# Patient Record
Sex: Female | Born: 1990 | Race: White | Hispanic: No | Marital: Single | Smoking: Former smoker
Health system: Southern US, Community
[De-identification: ages and names within clinical notes are randomized; demographics above are authoritative.]

## PROBLEM LIST (undated history)

## (undated) ENCOUNTER — Inpatient Hospital Stay: Payer: Self-pay

## (undated) DIAGNOSIS — Z8659 Personal history of other mental and behavioral disorders: Secondary | ICD-10-CM

## (undated) DIAGNOSIS — F845 Asperger's syndrome: Secondary | ICD-10-CM

## (undated) DIAGNOSIS — J45909 Unspecified asthma, uncomplicated: Secondary | ICD-10-CM

## (undated) DIAGNOSIS — Z8619 Personal history of other infectious and parasitic diseases: Secondary | ICD-10-CM

## (undated) DIAGNOSIS — F429 Obsessive-compulsive disorder, unspecified: Secondary | ICD-10-CM

## (undated) DIAGNOSIS — L409 Psoriasis, unspecified: Secondary | ICD-10-CM

## (undated) HISTORY — DX: Personal history of other infectious and parasitic diseases: Z86.19

## (undated) HISTORY — DX: Personal history of other mental and behavioral disorders: Z86.59

## (undated) HISTORY — DX: Obsessive-compulsive disorder, unspecified: F42.9

## (undated) HISTORY — DX: Unspecified asthma, uncomplicated: J45.909

## (undated) HISTORY — DX: Psoriasis, unspecified: L40.9

## (undated) HISTORY — PX: NO PAST SURGERIES: SHX2092

## (undated) HISTORY — DX: Asperger's syndrome: F84.5

---
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
## 2012-11-07 ENCOUNTER — Ambulatory Visit: Payer: Self-pay | Admitting: Family Medicine

## 2012-11-12 ENCOUNTER — Encounter: Payer: Self-pay | Admitting: Family Medicine

## 2012-11-12 ENCOUNTER — Ambulatory Visit (INDEPENDENT_AMBULATORY_CARE_PROVIDER_SITE_OTHER): Payer: BC Managed Care – PPO | Admitting: Family Medicine

## 2012-11-12 VITALS — BP 98/62 | HR 77 | Temp 98.8°F | Ht 64.75 in | Wt 93.5 lb

## 2012-11-12 DIAGNOSIS — F845 Asperger's syndrome: Secondary | ICD-10-CM | POA: Insufficient documentation

## 2012-11-12 DIAGNOSIS — Z23 Encounter for immunization: Secondary | ICD-10-CM

## 2012-11-12 DIAGNOSIS — F429 Obsessive-compulsive disorder, unspecified: Secondary | ICD-10-CM

## 2012-11-12 DIAGNOSIS — F411 Generalized anxiety disorder: Secondary | ICD-10-CM | POA: Insufficient documentation

## 2012-11-12 DIAGNOSIS — F848 Other pervasive developmental disorders: Secondary | ICD-10-CM

## 2012-11-12 MED ORDER — SERTRALINE HCL 50 MG PO TABS: 50.0000 mg | ORAL_TABLET | Freq: Every day | ORAL | Status: DC

## 2012-11-12 NOTE — Progress Notes (Signed)
Subjective:    Patient ID: Gloria Morris, female    DOB: [birth year], 22 y.o.   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 , and working full time  She likes it there  She eats a fairly healthy diet - has always been very very thin -- she moves all the time , has always had a hard time gaining weight  Has a habit of rocking  She does not have an exercise routine but likes to walk   OCD- things consume her at times (mental issues), and stays organized / likes things a certain way/ occ repeats herself  Anxiety- stress over the things that consume her - like boyfriend and work Does stay tightly wound and irritable (she tends to isolate herself ), and she is easy to yell and scream   Used so smoke and she quit   Last Tdap - was 1996    A little hard time eating or sleeping after breaking up with a boyfriend  Getting over that - 2 months ago   Patient Active Problem List   Diagnosis Date Noted  . Generalized anxiety disorder 11/12/2012  . OCD (obsessive compulsive disorder) 11/12/2012  . Asperger's disorder 11/12/2012   Past Medical History  Diagnosis Date  . History of chicken pox     around age 17  . Asperger syndrome 2009  . OCD (obsessive compulsive disorder)   . History of ADHD   . History of anxiety   . Psoriasis of scalp    No past surgical history on file. History  Substance Use Topics  . Smoking status: Former Games developer  . Smokeless tobacco: Not on file  . Alcohol Use: No   Family History  Problem Relation Age of Onset  . Drug abuse Brother   . High blood pressure Maternal Grandfather    No Known Allergies No current outpatient prescriptions on file prior to visit.   No current facility-administered medications on file prior to visit.    Review of Systems    Review of  Systems  Constitutional: Negative for fever, appetite change, fatigue and unexpected weight change.  Eyes: Negative for pain and visual disturbance.  Respiratory: Negative for cough and shortness of breath.   Cardiovascular: Negative for cp or palpitations    Gastrointestinal: Negative for nausea, diarrhea and constipation.  Genitourinary: Negative for urgency and frequency.  Skin: Negative for pallor or rash   Neurological: Negative for weakness, light-headedness, numbness and headaches.  Hematological: Negative for adenopathy. Does not bruise/bleed easily.  Psychiatric/Behavioral: pos for anxiety/ OCD/ social anx and concentration issues/ neg for SI     Objective:   Physical Exam  Constitutional: She appears well-developed and well-nourished. No distress.  Slim well appearing female  HENT:  Head: Normocephalic and atraumatic.  Right Ear: External ear normal.  Left Ear: External ear normal.  Mouth/Throat: Oropharynx is clear and moist.  Scant cerumen  Eyes: Conjunctivae and EOM are normal. Pupils are equal, round, and reactive to light. Right eye exhibits no discharge. Left eye exhibits no discharge. No scleral icterus.  Neck: Normal range of motion. Neck supple. No JVD present. Carotid bruit is not present. No thyromegaly present.  Cardiovascular: Normal rate, regular rhythm, normal heart sounds and intact distal pulses.  Exam reveals  no gallop.   Pulmonary/Chest: Effort normal and breath sounds normal. No respiratory distress. She has no wheezes.  Abdominal: Soft. Bowel sounds are normal. She exhibits no distension. There is no tenderness.  Musculoskeletal: She exhibits no edema.  Lymphadenopathy:    She has no cervical adenopathy.  Neurological: She is alert. She has normal reflexes. She displays no tremor. No cranial nerve deficit. She exhibits normal muscle tone. Coordination normal.  Skin: Skin is warm and dry. No rash noted. No erythema. No pallor.  Psychiatric: Her speech is  normal and behavior is normal. Her mood appears anxious. Her affect is not blunt and not labile. Thought content is not paranoid. She does not exhibit a depressed mood. She expresses no homicidal and no suicidal ideation.          Assessment & Plan:

## 2012-11-12 NOTE — Patient Instructions (Addendum)
Start zoloft 1/2 pill once daily in the evening for 2 weeks Then - if no problems increase to 1 pill each evening  If any intolerable side effects or if you feel worse-stop the medicine and call to let me know  Continue counseling  Follow up with me in 4-6 weeks

## 2012-11-14 NOTE — Assessment & Plan Note (Signed)
See HPI Rev notes from psychology In setting of asberger's and also OCD Trial of zoloft tirtate to 50 as tol -disc poss side eff in detail  Disc stressors/coping tech and lifestyle change >25 min spent with face to face with patient, >50% counseling and/or coordinating care  F/u planned

## 2012-11-14 NOTE — Assessment & Plan Note (Signed)
Rev notes from her counselor-and this is tricky in setting of Asperger's and also anxiety  Will try zoloft-titrate to 50 mg- disc poss side eff in detail Also disc imp of activity

## 2012-11-14 NOTE — Assessment & Plan Note (Signed)
Rev notes from psychologist  Will attempt to tx her anx and OCD with zoloft and counseling

## 2012-12-17 ENCOUNTER — Ambulatory Visit: Payer: BC Managed Care – PPO | Admitting: Family Medicine

## 2012-12-24 ENCOUNTER — Ambulatory Visit (INDEPENDENT_AMBULATORY_CARE_PROVIDER_SITE_OTHER): Payer: BC Managed Care – PPO | Admitting: Family Medicine

## 2012-12-24 ENCOUNTER — Encounter: Payer: Self-pay | Admitting: Family Medicine

## 2012-12-24 VITALS — BP 98/62 | HR 95 | Temp 98.4°F | Ht 64.75 in | Wt 99.8 lb

## 2012-12-24 DIAGNOSIS — F429 Obsessive-compulsive disorder, unspecified: Secondary | ICD-10-CM

## 2012-12-24 DIAGNOSIS — F411 Generalized anxiety disorder: Secondary | ICD-10-CM

## 2012-12-24 DIAGNOSIS — L600 Ingrowing nail: Secondary | ICD-10-CM

## 2012-12-24 DIAGNOSIS — Z309 Encounter for contraceptive management, unspecified: Secondary | ICD-10-CM | POA: Insufficient documentation

## 2012-12-24 MED ORDER — MUPIROCIN 2 % EX OINT: TOPICAL_OINTMENT | Freq: Three times a day (TID) | CUTANEOUS | Status: DC

## 2012-12-24 MED ORDER — NORETHINDRONE-ETH ESTRADIOL 1-35 MG-MCG PO TABS: 1.0000 | ORAL_TABLET | Freq: Every day | ORAL | Status: DC

## 2012-12-24 NOTE — Progress Notes (Signed)
Subjective:    Patient ID: Gloria Morris, female    DOB: February 10, 1991, 22 y.o.   MRN: [id number]  HPI Here for f/u of anxiety and OCD Started the zoloft - no side eff after starting it - getting used to it  ? If any imp in OCD  Sometimes work can be stressful and she deals with it   Has not seen counselor since last visit -- has an appt upcoming   Wt is up 6 lb - appetite is good- she is very happy about that  Protein shakes and instant bkfast   Met someone new - boyfriend  Just dating  This has helped with the breakup   Wants to talk about birth control  She took OC in the past (it decreased her appetite a bit but she did ok with it) No problems remembering to take a pill every day   Not a smoker anymore   Periods are in general very light - 1 -2 days  In the past she had heavy period  Is not sexually active now   Was screened for std a year ago   Sees [facility] for gyn - summer 2013  She will return there for a pap smear    Ingrown toenail right great nail (lateral)  Used peroxide  Soaks in salt water  Getting better   Patient Active Problem List   Diagnosis Date Noted  . Ingrown right big toenail 12/24/2012  . Contraception management 12/24/2012  . Generalized anxiety disorder 11/12/2012  . OCD (obsessive compulsive disorder) 11/12/2012  . Asperger's disorder 11/12/2012   Past Medical History  Diagnosis Date  . History of chicken pox     around age 41  . Asperger syndrome 2009  . OCD (obsessive compulsive disorder)   . History of ADHD   . History of anxiety   . Psoriasis of scalp    No past surgical history on file. History  Substance Use Topics  . Smoking status: Former Games developer  . Smokeless tobacco: Not on file  . Alcohol Use: No   Family History  Problem Relation Age of Onset  . Drug abuse Brother   . High blood pressure Maternal Grandfather    No Known Allergies Current Outpatient Prescriptions on File Prior to Visit  Medication Sig  Dispense Refill  . sertraline (ZOLOFT) 50 MG tablet Take 1 tablet (50 mg total) by mouth daily.  30 tablet  3   No current facility-administered medications on file prior to visit.    Review of Systems Review of Systems  Constitutional: Negative for fever, appetite change, fatigue and unexpected weight change.  Eyes: Negative for pain and visual disturbance.  Respiratory: Negative for cough and shortness of breath.   Cardiovascular: Negative for cp or palpitations    Gastrointestinal: Negative for nausea, diarrhea and constipation.  Genitourinary: Negative for urgency and frequency.  Skin: Negative for pallor or rash  pos for redness around toenail Neurological: Negative for weakness, light-headedness, numbness and headaches.  Hematological: Negative for adenopathy. Does not bruise/bleed easily.  Psychiatric/Behavioral: Negative for dysphoric mood. The patient is anxious at times .         Objective:   Physical Exam  Constitutional: She appears well-developed and well-nourished. No distress.  Underweight and well appearing   HENT:  Head: Normocephalic and atraumatic.  Mouth/Throat: Oropharynx is clear and moist.  Eyes: Conjunctivae and EOM are normal. Pupils are equal, round, and reactive to light.  Neck: Normal range of motion.  Neck supple. No JVD present. Carotid bruit is not present. No thyromegaly present.  Cardiovascular: Normal rate, regular rhythm, normal heart sounds and intact distal pulses.  Exam reveals no gallop.   Pulmonary/Chest: Effort normal and breath sounds normal. No respiratory distress. She has no wheezes.  Abdominal: Soft. Bowel sounds are normal. She exhibits no distension, no abdominal bruit and no mass. There is no tenderness.  No suprapubic tenderness or fullness    Musculoskeletal: She exhibits no edema and no tenderness.  Lymphadenopathy:    She has no cervical adenopathy.  Neurological: She is alert. She has normal reflexes. No cranial nerve deficit.  She exhibits normal muscle tone. Coordination normal.  Skin: Skin is warm and dry. No rash noted. No erythema. No pallor.  R great toenail is laterally ingrown with erythema and slight swelling No drainage Mildly tender  Psychiatric: She has a normal mood and affect.          Assessment & Plan:

## 2012-12-24 NOTE — Assessment & Plan Note (Signed)
Will start ortho novum 1/35 Long discussion re: way to take OC properly and avoidance of smoking  Risks of blood clots outlined as well as possible side eff Pt aware that this does not prevent stds and condoms should still be used inst that it may take up to 3 months for menses to fall into rhythm or side eff to stop  Adv to call if problems or questions

## 2012-12-24 NOTE — Assessment & Plan Note (Signed)
Improved with zoloft so far No change in stressors

## 2012-12-24 NOTE — Assessment & Plan Note (Signed)
Pt seems more relaxed after start of zoloft She wishes to continue it Appetite is better and wt gain noted

## 2012-12-24 NOTE — Patient Instructions (Addendum)
Start the oral contraceptive pill the first Sunday after your period starts - that would be this Sunday Take it at the same time every day with food and don't smoke  Use condoms if sexually active  See your gyn for annual exam  Keep working on increasing calories  Keep ingrown toenail clean with antibacterial soap and water and you can soak it in epsom soap/ water , and use the bactroban ointment 2-3 times per day   If any side effects or problems please let me know   Follow up in about 6 months or sooner if needed

## 2012-12-24 NOTE — Assessment & Plan Note (Signed)
Adv to soak/ keep clean and use bactroban ointment tid Let nail grow out and then trim it straight across Update if not starting to improve in a week or if worsening  -will ref to pod for toenail removal

## 2012-12-27 ENCOUNTER — Other Ambulatory Visit: Payer: Self-pay | Admitting: *Deleted

## 2012-12-27 MED ORDER — SERTRALINE HCL 50 MG PO TABS: 50.0000 mg | ORAL_TABLET | Freq: Every day | ORAL | Status: DC

## 2012-12-27 NOTE — Telephone Encounter (Signed)
Px sent

## 2012-12-27 NOTE — Telephone Encounter (Signed)
Received fax requesting 90 day supply to mail order pharmacy, please advise

## 2013-02-17 ENCOUNTER — Ambulatory Visit (INDEPENDENT_AMBULATORY_CARE_PROVIDER_SITE_OTHER): Payer: BC Managed Care – PPO | Admitting: Family Medicine

## 2013-02-17 ENCOUNTER — Encounter: Payer: Self-pay | Admitting: Family Medicine

## 2013-02-17 VITALS — BP 104/68 | HR 86 | Temp 98.2°F | Ht 64.75 in | Wt 92.0 lb

## 2013-02-17 DIAGNOSIS — Z309 Encounter for contraceptive management, unspecified: Secondary | ICD-10-CM

## 2013-02-17 LAB — POCT URINE PREGNANCY: Preg Test, Ur: NEGATIVE

## 2013-02-17 MED ORDER — MEDROXYPROGESTERONE ACETATE 150 MG/ML IM SUSP
150.0000 mg | Freq: Once | INTRAMUSCULAR | Status: AC
  Administered 2013-02-17: 150 mg via INTRAMUSCULAR

## 2013-02-17 NOTE — Progress Notes (Signed)
Subjective:    Patient ID: Gloria Morris, female    DOB: [birth year], 22 y.o.   MRN: [id number]  HPI Here to disc contraception management - pt is on ortho novum 1/35 She has a hard time remembering to take it every day - tends to miss days Very light period monthly - and not bothersome   Interested in depo provera- she knows a bit about it  Knows that this will also help her gain weight   She has lost 7 lb with bmi of 15 - very thin   (she was surprised by this) She eats a lot of fast food- but has a physical job - always busy  She does drink a lot of coffee   Breakfast - not very hungry- often eats cereal or waffles  (her lightest meal)  Also continental bkfast at her job  Lunch - fast food - hamburger or chicken nuggets , and she used to eat a lot of fruit  Is drinking ensure - often --350 calories  Dinner - every night --meat and a vegetable   Urine preg test is neg   Patient Active Problem List   Diagnosis Date Noted  . Ingrown right big toenail 12/24/2012  . Contraception management 12/24/2012  . Generalized anxiety disorder 11/12/2012  . OCD (obsessive compulsive disorder) 11/12/2012  . Asperger's disorder 11/12/2012   Past Medical History  Diagnosis Date  . History of chicken pox     around age 34  . Asperger syndrome 2009  . OCD (obsessive compulsive disorder)   . History of ADHD   . History of anxiety   . Psoriasis of scalp    No past surgical history on file. History  Substance Use Topics  . Smoking status: Former Games developer  . Smokeless tobacco: Not on file  . Alcohol Use: No   Family History  Problem Relation Age of Onset  . Drug abuse Brother   . High blood pressure Maternal Grandfather    No Known Allergies Current Outpatient Prescriptions on File Prior to Visit  Medication Sig Dispense Refill  . mupirocin ointment (BACTROBAN) 2 % Apply topically 3 (three) times daily. To affected area on toe  22 g  0  . norethindrone-ethinyl estradiol 1/35  (ORTHO-NOVUM, NORTREL,CYCLAFEM) tablet Take 1 tablet by mouth daily.  1 Package  11  . sertraline (ZOLOFT) 50 MG tablet Take 1 tablet (50 mg total) by mouth daily.  90 tablet  3   No current facility-administered medications on file prior to visit.    Review of Systems Review of Systems  Constitutional: Negative for fever, appetite change, fatigue and unexpected weight change.  Eyes: Negative for pain and visual disturbance.  Respiratory: Negative for cough and shortness of breath.   Cardiovascular: Negative for cp or palpitations    Gastrointestinal: Negative for nausea, diarrhea and constipation.  Genitourinary: Negative for urgency and frequency.  Skin: Negative for pallor or rash   Neurological: Negative for weakness, light-headedness, numbness and headaches.  Hematological: Negative for adenopathy. Does not bruise/bleed easily.  Psychiatric/Behavioral: Negative for dysphoric mood. The patient is  nervous/anxious.  at times        Objective:   Physical Exam  Constitutional: She appears well-developed. No distress.  Underweight and well appearing   HENT:  Head: Normocephalic.  Eyes: Conjunctivae and EOM are normal. Pupils are equal, round, and reactive to light. No scleral icterus.  Neck: Normal range of motion. Neck supple. Carotid bruit is not present. No thyromegaly present.  Cardiovascular: Normal rate and regular rhythm.   Pulmonary/Chest: Effort normal and breath sounds normal.  Musculoskeletal: She exhibits no edema.  Lymphadenopathy:    She has no cervical adenopathy.  Neurological: She is alert. She has normal reflexes.  Skin: Skin is warm and dry. No pallor.  Psychiatric: She has a normal mood and affect.  Cheerful and talkative            Assessment & Plan:

## 2013-02-17 NOTE — Patient Instructions (Signed)
Depo provera injection today You will need this shot every 3 months - make your next appt on the way out If any problems or side effects let me know Continue using condoms for STD prevention

## 2013-02-17 NOTE — Assessment & Plan Note (Signed)
Will change to depo provera injection q3 mo due to difficulty remembering to take a daily pill  Disc side eff Wt gain would be welcomed  Disc imp of balanced diet with regular meals

## 2013-02-17 NOTE — Progress Notes (Signed)
Pre-visit discussion using our clinic review tool. No additional management support is needed unless otherwise documented below in the visit note.  

## 2013-05-08 ENCOUNTER — Ambulatory Visit (INDEPENDENT_AMBULATORY_CARE_PROVIDER_SITE_OTHER): Payer: BC Managed Care – PPO

## 2013-05-08 DIAGNOSIS — Z309 Encounter for contraceptive management, unspecified: Secondary | ICD-10-CM

## 2013-05-08 MED ORDER — MEDROXYPROGESTERONE ACETATE 150 MG/ML IM SUSP
150.0000 mg | Freq: Once | INTRAMUSCULAR | Status: AC
  Administered 2013-05-08: 150 mg via INTRAMUSCULAR

## 2013-06-23 ENCOUNTER — Ambulatory Visit: Payer: BC Managed Care – PPO | Admitting: Family Medicine

## 2013-07-24 ENCOUNTER — Ambulatory Visit: Payer: BC Managed Care – PPO

## 2014-02-05 ENCOUNTER — Other Ambulatory Visit: Payer: Self-pay | Admitting: Family Medicine

## 2014-02-05 NOTE — Telephone Encounter (Signed)
Please schedule a winter f/u and refill until then 

## 2014-02-05 NOTE — Telephone Encounter (Signed)
Electronic refill request, please advise  

## 2014-02-05 NOTE — Telephone Encounter (Signed)
Called pt and no answer and no voicemail set up 

## 2014-02-06 NOTE — Telephone Encounter (Signed)
Tried calling patient, VM not set up, refill denied until patient calls and set up appt.

## 2017-04-30 LAB — OB RESULTS CONSOLE ANTIBODY SCREEN: Antibody Screen: NEGATIVE

## 2017-04-30 LAB — OB RESULTS CONSOLE ABO/RH: RH Type: POSITIVE

## 2017-04-30 LAB — OB RESULTS CONSOLE HEPATITIS B SURFACE ANTIGEN: HEP B S AG: NEGATIVE

## 2017-04-30 LAB — OB RESULTS CONSOLE RPR: RPR: NONREACTIVE

## 2017-04-30 LAB — OB RESULTS CONSOLE RUBELLA ANTIBODY, IGM: RUBELLA: NON-IMMUNE/NOT IMMUNE

## 2017-04-30 LAB — OB RESULTS CONSOLE HIV ANTIBODY (ROUTINE TESTING): HIV: NONREACTIVE

## 2017-04-30 LAB — OB RESULTS CONSOLE HGB/HCT, BLOOD: Hemoglobin: 11.5

## 2017-05-21 ENCOUNTER — Encounter: Payer: Medicaid Other | Admitting: Advanced Practice Midwife

## 2017-05-23 ENCOUNTER — Observation Stay
Admission: EM | Admit: 2017-05-23 | Discharge: 2017-05-23 | Disposition: A | Discharge status: Home / Self Care | Payer: Medicaid Other | Attending: Obstetrics & Gynecology | Admitting: Obstetrics & Gynecology

## 2017-05-23 DIAGNOSIS — Z79899 Other long term (current) drug therapy: Secondary | ICD-10-CM | POA: Diagnosis not present

## 2017-05-23 DIAGNOSIS — B9689 Other specified bacterial agents as the cause of diseases classified elsewhere: Secondary | ICD-10-CM | POA: Diagnosis present

## 2017-05-23 DIAGNOSIS — N390 Urinary tract infection, site not specified: Secondary | ICD-10-CM | POA: Diagnosis present

## 2017-05-23 DIAGNOSIS — Z3A26 26 weeks gestation of pregnancy: Secondary | ICD-10-CM | POA: Diagnosis not present

## 2017-05-23 DIAGNOSIS — O2342 Unspecified infection of urinary tract in pregnancy, second trimester: Secondary | ICD-10-CM | POA: Insufficient documentation

## 2017-05-23 DIAGNOSIS — N760 Acute vaginitis: Secondary | ICD-10-CM

## 2017-05-23 DIAGNOSIS — F429 Obsessive-compulsive disorder, unspecified: Secondary | ICD-10-CM | POA: Diagnosis not present

## 2017-05-23 DIAGNOSIS — O23592 Infection of other part of genital tract in pregnancy, second trimester: Secondary | ICD-10-CM

## 2017-05-23 DIAGNOSIS — O99342 Other mental disorders complicating pregnancy, second trimester: Secondary | ICD-10-CM | POA: Diagnosis not present

## 2017-05-23 DIAGNOSIS — F845 Asperger's syndrome: Secondary | ICD-10-CM | POA: Insufficient documentation

## 2017-05-23 DIAGNOSIS — O9989 Other specified diseases and conditions complicating pregnancy, childbirth and the puerperium: Secondary | ICD-10-CM | POA: Diagnosis present

## 2017-05-23 LAB — WET PREP, GENITAL
Sperm: NONE SEEN
TRICH WET PREP: NONE SEEN
Yeast Wet Prep HPF POC: NONE SEEN

## 2017-05-23 LAB — URINALYSIS, COMPLETE (UACMP) WITH MICROSCOPIC
Bilirubin Urine: NEGATIVE
GLUCOSE, UA: NEGATIVE mg/dL
Hgb urine dipstick: NEGATIVE
Ketones, ur: NEGATIVE mg/dL
Nitrite: NEGATIVE
Protein, ur: NEGATIVE mg/dL
SPECIFIC GRAVITY, URINE: 1.013 (ref 1.005–1.030)
pH: 6 (ref 5.0–8.0)

## 2017-05-23 LAB — CHLAMYDIA/NGC RT PCR (ARMC ONLY)
CHLAMYDIA TR: NOT DETECTED
N GONORRHOEAE: NOT DETECTED

## 2017-05-23 MED ORDER — METRONIDAZOLE 0.75 % VA GEL: 1.0000 | Freq: Every day | VAGINAL | 0 refills | Status: AC

## 2017-05-23 MED ORDER — CEPHALEXIN 500 MG PO CAPS: 500.0000 mg | ORAL_CAPSULE | Freq: Two times a day (BID) | ORAL | 0 refills | Status: AC

## 2017-05-23 MED ORDER — ACETAMINOPHEN 325 MG PO TABS: 650.0000 mg | ORAL_TABLET | ORAL | Status: DC | PRN

## 2017-05-23 NOTE — Final Progress Note (Addendum)
Physician Final Progress Note  Patient ID: Gloria Morris MRN: [id number] DOB/AGE: 28/09/1990 28 y.o.  Admit date: 05/23/2017 Admitting provider: Nadara Mustardobert P Harris, MD Discharge date: 05/23/2017   Admission Diagnoses: Possible rupture of membranes  Discharge Diagnoses:  Active Problems:   Bacterial vaginosis   Urinary tract infection   History of Present Illness: The patient is a 27 y.o. female G1P0 at 726w 2d who presents for an episode today at 1100 where she felt fluid on her leg that was clear and odorless. She has not been wearing a pad and has not noticed any more leaking since then. She has a similar episode a week ago. She has noticed occasional intermittent uterine cramping daily, especially at night. Nitrazine was negative on presentation to triage. She denies vaginal bleeding and endorses good fetal movement.  10 point review of systems negative unless otherwise noted in HPI.  Past Medical History:  Diagnosis Date  . Asperger syndrome 2009  . History of ADHD   . History of anxiety   . History of chicken pox    around age 89  . OCD (obsessive compulsive disorder)   . Psoriasis of scalp     History reviewed. No pertinent surgical history.  No current facility-administered medications on file prior to encounter.    Current Outpatient Medications on File Prior to Encounter  Medication Sig Dispense Refill  . Prenatal Vit-Fe Fumarate-FA (PRENATAL MULTIVITAMIN) TABS tablet Take 1 tablet by mouth daily at 12 noon.    . mupirocin ointment (BACTROBAN) 2 % Apply topically 3 (three) times daily. To affected area on toe (Patient not taking: Reported on 05/23/2017) 22 g 0  . sertraline (ZOLOFT) 50 MG tablet Take 1 tablet (50 mg total) by mouth daily. (Patient not taking: Reported on 05/23/2017) 90 tablet 3    No Known Allergies  Social History   Socioeconomic History  . Marital status: Single    Spouse name: Not on file  . Number of children: Not on file  . Years of  education: Not on file  . Highest education level: Not on file  Social Needs  . Financial resource strain: Not on file  . Food insecurity - worry: Not on file  . Food insecurity - inability: Not on file  . Transportation needs - medical: Not on file  . Transportation needs - non-medical: Not on file  Occupational History  . Not on file  Tobacco Use  . Smoking status: Never Smoker  . Smokeless tobacco: Never Used  Substance and Sexual Activity  . Alcohol use: No  . Drug use: No  . Sexual activity: Not on file  Other Topics Concern  . Not on file  Social History Narrative  . Not on file    Physical Exam: BP (!) 111/51   Pulse 93   Temp 98.3 F (36.8 C) (Oral)   Resp 16   Gen: NAD CV: RRR Abdomen: gravid, non-tender, non-distended Pelvic: SSE: cervix visually closed, no blood in vault, no pooling, moderate amount of thin, white, adherent discharge present, no ferning on microscopic examination, nitrazine negative, pH>4.5 Ext: no signs of DVT  FWB: Category I EFM with baseline 145, moderate variability, positive accelerations, negative decelerations, toco shows occasional uterine irritability.  Consults: None  Significant Findings/ Diagnostic Studies: labs: clue cells present on wet prep, UA findings consistent with UTI. Urine culture and GC pending.  Procedures: SSE  Discharge Condition: good  Disposition: Discharge home with antimicrobal therapy  Diet: Regular diet  Discharge Activity:  Activity as tolerated  Discharge Instructions    Discharge activity:  No Restrictions   Complete by:  As directed    Discharge diet:  No restrictions   Complete by:  As directed    No sexual activity restrictions   Complete by:  As directed    Notify physician for a general feeling that "something is not right"   Complete by:  As directed    Notify physician for increase or change in vaginal discharge   Complete by:  As directed    Notify physician for intestinal cramps, with  or without diarrhea, sometimes described as "gas pain"   Complete by:  As directed    Notify physician for leaking of fluid   Complete by:  As directed    Notify physician for low, dull backache, unrelieved by heat or Tylenol   Complete by:  As directed    Notify physician for menstrual like cramps   Complete by:  As directed    Notify physician for pelvic pressure   Complete by:  As directed    Notify physician for uterine contractions.  These may be painless and feel like the uterus is tightening or the baby is  "balling up"   Complete by:  As directed    Notify physician for vaginal bleeding   Complete by:  As directed    PRETERM LABOR:  Includes any of the follwing symptoms that occur between 20 - [redacted] weeks gestation.  If these symptoms are not stopped, preterm labor can result in preterm delivery, placing your baby at risk   Complete by:  As directed      Allergies as of 05/23/2017   No Known Allergies     Medication List    STOP taking these medications   mupirocin ointment 2 % Commonly known as:  BACTROBAN   sertraline 50 MG tablet Commonly known as:  ZOLOFT     TAKE these medications   cephALEXin 500 MG capsule Commonly known as:  KEFLEX Take 1 capsule (500 mg total) by mouth 2 (two) times daily for 10 days.   metroNIDAZOLE 0.75 % vaginal gel Commonly known as:  METROGEL Place 1 Applicatorful vaginally at bedtime for 5 days.   prenatal multivitamin Tabs tablet Take 1 tablet by mouth daily at 12 noon.      Will notify patient if pending labs require change in therapy. NOB appointment at [facility] on 05/28/2017, patient states that records have been requested from previous practice in [place].  Total time spent taking care of this patient: 20 minutes  Signed: Oswaldo Conroy, CNM  05/23/2017, 4:20 PM

## 2017-05-23 NOTE — Discharge Summary (Signed)
See Final Progress Note 05/23/2017.  Marcelyn BruinsJacelyn Bowden Boody, CNM 05/23/2017  4:23 PM

## 2017-05-23 NOTE — OB Triage Note (Signed)
27 y/o G1P0 27w presents to [facility] d/t LOF. Pt says she noticed a gush of fluid one week ago and then today at 1100am she noticed fluid running down her leg that was clear and odorless. She reports positive fetal movement and denies vaginal bleeding. VSS. Monitors applied and assessing. Nitrazine negative.

## 2017-05-24 ENCOUNTER — Telehealth: Payer: Self-pay

## 2017-05-24 LAB — URINE CULTURE: Culture: NO GROWTH

## 2017-05-24 NOTE — Telephone Encounter (Signed)
Pt possibly leaking fluid. [phone]  [facility].  I didn't hear back from pt.  I called her again 2/14 9:05am Pt states she was in the ED when she received my call.  She was dx'd c UTI and BV, was given a cream and an antibx.

## 2017-05-28 ENCOUNTER — Encounter: Payer: Self-pay | Admitting: Obstetrics and Gynecology

## 2017-05-28 ENCOUNTER — Ambulatory Visit (INDEPENDENT_AMBULATORY_CARE_PROVIDER_SITE_OTHER): Payer: Medicaid Other | Admitting: Obstetrics and Gynecology

## 2017-05-28 ENCOUNTER — Other Ambulatory Visit: Payer: Self-pay | Admitting: Obstetrics and Gynecology

## 2017-05-28 VITALS — BP 102/62 | Wt 123.0 lb

## 2017-05-28 DIAGNOSIS — Z2839 Other underimmunization status: Secondary | ICD-10-CM | POA: Insufficient documentation

## 2017-05-28 DIAGNOSIS — D649 Anemia, unspecified: Secondary | ICD-10-CM

## 2017-05-28 DIAGNOSIS — Z3400 Encounter for supervision of normal first pregnancy, unspecified trimester: Secondary | ICD-10-CM

## 2017-05-28 DIAGNOSIS — Z1379 Encounter for other screening for genetic and chromosomal anomalies: Secondary | ICD-10-CM

## 2017-05-28 DIAGNOSIS — Z3A27 27 weeks gestation of pregnancy: Secondary | ICD-10-CM

## 2017-05-28 DIAGNOSIS — Z3490 Encounter for supervision of normal pregnancy, unspecified, unspecified trimester: Secondary | ICD-10-CM

## 2017-05-28 DIAGNOSIS — O09899 Supervision of other high risk pregnancies, unspecified trimester: Secondary | ICD-10-CM

## 2017-05-28 DIAGNOSIS — K5903 Drug induced constipation: Secondary | ICD-10-CM

## 2017-05-28 DIAGNOSIS — O9989 Other specified diseases and conditions complicating pregnancy, childbirth and the puerperium: Secondary | ICD-10-CM

## 2017-05-28 DIAGNOSIS — Z283 Underimmunization status: Secondary | ICD-10-CM

## 2017-05-28 MED ORDER — FERROUS SULFATE 325 (65 FE) MG PO TABS: 325.0000 mg | ORAL_TABLET | Freq: Two times a day (BID) | ORAL | 1 refills | Status: DC

## 2017-05-28 MED ORDER — DOCUSATE SODIUM 100 MG PO CAPS: 100.0000 mg | ORAL_CAPSULE | Freq: Two times a day (BID) | ORAL | 2 refills | Status: DC | PRN

## 2017-05-28 NOTE — Progress Notes (Addendum)
Pt reports no problems. NOB transfer of care from [facility]. Records received.    05/28/2017   Chief Complaint: Missed period  Transfer of Care Patient: yes  History of Present Illness: Ms. Gloria Morris is a 26 y.o. G1P0 [redacted]w[redacted]d based on No LMP recorded. Patient is pregnant. with an Estimated Date of Delivery: 08/27/17, with the above CC.   Her periods were: regular periods every 28 days She was using no method when she conceived.  She has Negative signs or symptoms of nausea/vomiting of pregnancy. She has Negative signs or symptoms of miscarriage or preterm labor She identifies Negative Zika risk factors for her and her partner On any different medications around the time she conceived/early pregnancy: No  History of varicella: Yes   ROS: A 12-point review of systems was performed and negative, except as stated in the above HPI.  OBGYN History: As per HPI. OB History  Gravida Para Term Preterm AB Living  1            SAB TAB Ectopic Multiple Live Births               # Outcome Date GA Lbr Len/2nd Weight Sex Delivery Anes PTL Lv  1 Current               Any issues with any prior pregnancies: not applicable Any prior children are healthy, doing well, without any problems or issues: not applicable History of pap smears: Yes. Last pap smear 2019. NIL History of STIs: No   Past Medical History: Past Medical History:  Diagnosis Date  . Asperger syndrome 2009  . History of ADHD   . History of anxiety   . History of chicken pox    around age 3  . OCD (obsessive compulsive disorder)   . Psoriasis of scalp     Past Surgical History: Past Surgical History:  Procedure Laterality Date  . NO PAST SURGERIES      Family History:  Family History  Problem Relation Age of Onset  . Drug abuse Brother   . High blood pressure Maternal Grandfather    She denies any female cancers, bleeding or blood clotting disorders.  She denies any history of mental retardation, birth defects or  genetic disorders in her or the FOB's history  Social History:  Social History   Socioeconomic History  . Marital status: Single    Spouse name: Not on file  . Number of children: Not on file  . Years of education: Not on file  . Highest education level: Not on file  Social Needs  . Financial resource strain: Not on file  . Food insecurity - worry: Not on file  . Food insecurity - inability: Not on file  . Transportation needs - medical: Not on file  . Transportation needs - non-medical: Not on file  Occupational History  . Not on file  Tobacco Use  . Smoking status: Never Smoker  . Smokeless tobacco: Never Used  Substance and Sexual Activity  . Alcohol use: No  . Drug use: No  . Sexual activity: Not Currently    Birth control/protection: None  Other Topics Concern  . Not on file  Social History Narrative  . Not on file   Any pets in the household: no Cautioned about cat liter  Allergy: No Known Allergies  Current Outpatient Medications:  Current Outpatient Medications:  .  cephALEXin (KEFLEX) 500 MG capsule, Take 1 capsule (500 mg total) by mouth 2 (two) times daily   for 10 days., Disp: 20 capsule, Rfl: 0 .  metroNIDAZOLE (METROGEL) 0.75 % vaginal gel, Place 1 Applicatorful vaginally at bedtime for 5 days., Disp: 50 g, Rfl: 0 .  Prenatal Vit-Fe Fumarate-FA (PRENATAL MULTIVITAMIN) TABS tablet, Take 1 tablet by mouth daily at 12 noon., Disp: , Rfl:    Physical Exam:   BP 102/62   Wt 123 lb (55.8 kg)   BMI 20.63 kg/m  Body mass index is 20.63 kg/m. Constitutional: Well nourished, well developed female in no acute distress.  Neck:  Supple, normal appearance, and no thyromegaly  Cardiovascular: S1, S2 normal, no murmur, rub or gallop, regular rate and rhythm Respiratory:  Clear to auscultation bilateral. Normal respiratory effort Abdomen: positive bowel sounds and no masses, hernias; diffusely non tender to palpation, non distended Neuro/Psych:  Normal mood and  affect.  Skin:  Warm and dry.  Lymphatic:  No inguinal lymphadenopathy.   Uterus Fundal height 26cm FHR 141 bpm  Assessment: Gloria Morris is a 26 y.o. G1P0 [redacted]w[redacted]d based on No LMP recorded. Patient is pregnant. with an Estimated Date of Delivery: 08/27/17,  for prenatal care.  Plan:  1) Avoid alcoholic beverages. 2) Patient encouraged not to smoke.  3) Discontinue the use of all non-medicinal drugs and chemicals.  4) Take prenatal vitamins daily.  5) Seatbelt use advised 6) Nutrition, food safety (fish, cheese advisories, and high nitrite foods) and exercise discussed. 7) Hospital and practice style delivering at [facility] discussed  8) Patient is asked about travel to areas at risk for the Zika virus, and counseled to avoid travel and exposure to mosquitoes or sexual partners who may have themselves been exposed to the virus. Testing is discussed, and will be ordered as appropriate.  9) Childbirth classes at [facility] advised 10) Genetic Screening, such as with 1st Trimester Screening, cell free fetal DNA, AFP testing, and Ultrasound, as well as with amniocentesis and CVS as appropriate, is discussed with patient. She plans to have genetic testing this pregnancy. Maternity 21 ordered today 11) Anemia, recommended BID ferrous sulfate and iron rich foods discussed 12) Fetal pyelectasis- infant will need US after birth  Problem list reviewed and updated.  Christanna Schuman MD [facility], [facility] 05/28/2017  2:46 PM    

## 2017-05-28 NOTE — Progress Notes (Signed)
Varicella lab ordered

## 2017-05-28 NOTE — Patient Instructions (Signed)

## 2017-06-02 LAB — MATERNIT 21 PLUS CORE, BLOOD
CHROMOSOME 18: NEGATIVE
Chromosome 13: NEGATIVE
Chromosome 21: NEGATIVE
Y Chromosome: NOT DETECTED

## 2017-06-03 NOTE — Progress Notes (Signed)
Called, no answer, please call patient with result. Normal XX fetus. Thank you. Dr. Jerene PitchSchuman.

## 2017-06-03 NOTE — Progress Notes (Signed)
Pt reports no problems. [facility] Records received.    05/28/2017   Chief Complaint: Missed period  Transfer of Care Patient: yes  History of Present Illness: Ms. Gloria Morris is a 27 y.o. G1P0 7343w0d based on No LMP recorded. Patient is pregnant. with an Estimated Date of Delivery: 08/27/17, with the above CC.   Her periods were: regular periods every 28 days She was using no method when she conceived.  She has Negative signs or symptoms of nausea/vomiting of pregnancy. She has Negative signs or symptoms of miscarriage or preterm labor She identifies Negative Zika risk factors for her and her partner On any different medications around the time she conceived/early pregnancy: No  History of varicella: Yes   ROS: A 12-point review of systems was performed and negative, except as stated in the above HPI.  OBGYN History: As per HPI. OB History  Gravida Para Term Preterm AB Living  1            SAB TAB Ectopic Multiple Live Births               # Outcome Date GA Lbr Len/2nd Weight Sex Delivery Anes PTL Lv  1 Current               Any issues with any prior pregnancies: not applicable Any prior children are healthy, doing well, without any problems or issues: not applicable History of pap smears: Yes. Last pap smear 2019. NIL History of STIs: No   Past Medical History: Past Medical History:  Diagnosis Date  . Asperger syndrome 2009  . History of ADHD   . History of anxiety   . History of chicken pox    around age 89  . OCD (obsessive compulsive disorder)   . Psoriasis of scalp     Past Surgical History: Past Surgical History:  Procedure Laterality Date  . NO PAST SURGERIES      Family History:  Family History  Problem Relation Age of Onset  . Drug abuse Brother   . High blood pressure Maternal Grandfather    She denies any female cancers, bleeding or blood clotting disorders.  She denies any history of mental retardation, birth defects or  genetic disorders in her or the FOB's history  Social History:  Social History   Socioeconomic History  . Marital status: Single    Spouse name: Not on file  . Number of children: Not on file  . Years of education: Not on file  . Highest education level: Not on file  Social Needs  . Financial resource strain: Not on file  . Food insecurity - worry: Not on file  . Food insecurity - inability: Not on file  . Transportation needs - medical: Not on file  . Transportation needs - non-medical: Not on file  Occupational History  . Not on file  Tobacco Use  . Smoking status: Never Smoker  . Smokeless tobacco: Never Used  Substance and Sexual Activity  . Alcohol use: No  . Drug use: No  . Sexual activity: Not Currently    Birth control/protection: None  Other Topics Concern  . Not on file  Social History Narrative  . Not on file   Any pets in the household: no Cautioned about cat liter  Allergy: No Known Allergies  Current Outpatient Medications:  Current Outpatient Medications:  .  cephALEXin (KEFLEX) 500 MG capsule, Take 1 capsule (500 mg total) by mouth 2 (two) times daily  for 10 days., Disp: 20 capsule, Rfl: 0 .  metroNIDAZOLE (METROGEL) 0.75 % vaginal gel, Place 1 Applicatorful vaginally at bedtime for 5 days., Disp: 50 g, Rfl: 0 .  Prenatal Vit-Fe Fumarate-FA (PRENATAL MULTIVITAMIN) TABS tablet, Take 1 tablet by mouth daily at 12 noon., Disp: , Rfl:    Physical Exam:   BP 102/62   Wt 123 lb (55.8 kg)   BMI 20.63 kg/m  Body mass index is 20.63 kg/m. Constitutional: Well nourished, well developed female in no acute distress.  Neck:  Supple, normal appearance, and no thyromegaly  Cardiovascular: S1, S2 normal, no murmur, rub or gallop, regular rate and rhythm Respiratory:  Clear to auscultation bilateral. Normal respiratory effort Abdomen: positive bowel sounds and no masses, hernias; diffusely non tender to palpation, non distended Neuro/Psych:  Normal mood and  affect.  Skin:  Warm and dry.  Lymphatic:  No inguinal lymphadenopathy.   Uterus Fundal height 26cm FHR 141 bpm  Assessment: Gloria Morris is a 27 y.o. G1P0 [redacted]w[redacted]d based on No LMP recorded. Patient is pregnant. with an Estimated Date of Delivery: 08/27/17,  for prenatal care.  Plan:  1) Avoid alcoholic beverages. 2) Patient encouraged not to smoke.  3) Discontinue the use of all non-medicinal drugs and chemicals.  4) Take prenatal vitamins daily.  5) Seatbelt use advised 6) Nutrition, food safety (fish, cheese advisories, and high nitrite foods) and exercise discussed. 7) Hospital and practice style delivering at [facility] discussed  8) Patient is asked about travel to areas at risk for the Zika virus, and counseled to avoid travel and exposure to mosquitoes or sexual partners who may have themselves been exposed to the virus. Testing is discussed, and will be ordered as appropriate.  9) Childbirth classes at [facility] advised 10) Genetic Screening, such as with 1st Trimester Screening, cell free fetal DNA, AFP testing, and Ultrasound, as well as with amniocentesis and CVS as appropriate, is discussed with patient. She plans to have genetic testing this pregnancy. Maternity 21 ordered today 11) Anemia, recommended BID ferrous sulfate and iron rich foods discussed 12) Fetal pyelectasis- infant will need [place] after birth  Problem list reviewed and updated.  Adelene Idler MD [facility], [facility] 05/28/2017  2:46 PM

## 2017-06-05 ENCOUNTER — Ambulatory Visit (INDEPENDENT_AMBULATORY_CARE_PROVIDER_SITE_OTHER): Payer: Medicaid Other | Admitting: Obstetrics & Gynecology

## 2017-06-05 ENCOUNTER — Other Ambulatory Visit: Payer: Medicaid Other

## 2017-06-05 VITALS — BP 120/80 | Wt 118.0 lb

## 2017-06-05 DIAGNOSIS — O35EXX0 Maternal care for other (suspected) fetal abnormality and damage, fetal genitourinary anomalies, not applicable or unspecified: Secondary | ICD-10-CM

## 2017-06-05 DIAGNOSIS — Z3A28 28 weeks gestation of pregnancy: Secondary | ICD-10-CM

## 2017-06-05 DIAGNOSIS — Z3400 Encounter for supervision of normal first pregnancy, unspecified trimester: Secondary | ICD-10-CM

## 2017-06-05 DIAGNOSIS — O358XX0 Maternal care for other (suspected) fetal abnormality and damage, not applicable or unspecified: Secondary | ICD-10-CM

## 2017-06-05 NOTE — Progress Notes (Signed)
  Subjective  Fetal Movement? yes Contractions? no Leaking Fluid? no Vaginal Bleeding? no  Objective  BP 120/80   Wt 118 lb (53.5 kg)   BMI 19.79 kg/m  General: NAD Pumonary: no increased work of breathing Abdomen: gravid, non-tender Extremities: no edema Psychiatric: mood appropriate, affect full  Assessment  27 y.o. G1P0 at 44w1dby  08/27/2017, by Other Basis presenting for routine prenatal visit  Plan   Problem List Items Addressed This Visit      Other   Supervision of normal first pregnancy, antepartum    Other Visit Diagnoses    [redacted] weeks gestation of pregnancy    -  Primary   Relevant Orders   [place] Follow Up   Encounter for repeat ultrasound of fetal pyelectasis in singleton pregnancy, antepartum       Relevant Orders   [place] Follow Up    Flu shot discussed, not today but will consider TDaP discussed, nv MMR pp [place] for follow up here on fetal pyelectasis; plan pediatrician f/u after delivery 28 weeks labs soon cfDNA discussed PNV, [place] and mom discussed her Aspergers Syn; possible anxiety surrounding pregnancy and labor; to be aware  PBarnett Applebaum MD, [facility], [facility] 06/05/2017  11:50 AM

## 2017-06-05 NOTE — Patient Instructions (Signed)
Third Trimester of Pregnancy The third trimester is from week 28 through week 40 (months 7 through 9). The third trimester is a time when the unborn baby (fetus) is growing rapidly. At the end of the ninth month, the fetus is about 20 inches in length and weighs 6-10 pounds. Body changes during your third trimester Your body will continue to go through many changes during pregnancy. The changes vary from woman to woman. During the third trimester:  Your weight will continue to increase. You can expect to gain 25-35 pounds (11-16 kg) by the end of the pregnancy.  You may begin to get stretch marks on your hips, abdomen, and breasts.  You may urinate more often because the fetus is moving lower into your pelvis and pressing on your bladder.  You may develop or continue to have heartburn. This is caused by increased hormones that slow down muscles in the digestive tract.  You may develop or continue to have constipation because increased hormones slow digestion and cause the muscles that push waste through your intestines to relax.  You may develop hemorrhoids. These are swollen veins (varicose veins) in the rectum that can itch or be painful.  You may develop swollen, bulging veins (varicose veins) in your legs.  You may have increased body aches in the pelvis, back, or thighs. This is due to weight gain and increased hormones that are relaxing your joints.  You may have changes in your hair. These can include thickening of your hair, rapid growth, and changes in texture. Some women also have hair loss during or after pregnancy, or hair that feels dry or thin. Your hair will most likely return to normal after your baby is born.  Your breasts will continue to grow and they will continue to become tender. A yellow fluid (colostrum) may leak from your breasts. This is the first milk you are producing for your baby.  Your belly button may stick out.  You may notice more swelling in your hands,  face, or ankles.  You may have increased tingling or numbness in your hands, arms, and legs. The skin on your belly may also feel numb.  You may feel short of breath because of your expanding uterus.  You may have more problems sleeping. This can be caused by the size of your belly, increased need to urinate, and an increase in your body's metabolism.  You may notice the fetus "dropping," or moving lower in your abdomen (lightening).  You may have increased vaginal discharge.  You may notice your joints feel loose and you may have pain around your pelvic bone.  What to expect at prenatal visits You will have prenatal exams every 2 weeks until week 36. Then you will have weekly prenatal exams. During a routine prenatal visit:  You will be weighed to make sure you and the baby are growing normally.  Your blood pressure will be taken.  Your abdomen will be measured to track your baby's growth.  The fetal heartbeat will be listened to.  Any test results from the previous visit will be discussed.  You may have a cervical check near your due date to see if your cervix has softened or thinned (effaced).  You will be tested for Group B streptococcus. This happens between 35 and 37 weeks.  Your health care provider may ask you:  What your birth plan is.  How you are feeling.  If you are feeling the baby move.  If you have had   any abnormal symptoms, such as leaking fluid, bleeding, severe headaches, or abdominal cramping.  If you are using any tobacco products, including cigarettes, chewing tobacco, and electronic cigarettes.  If you have any questions.  Other tests or screenings that may be performed during your third trimester include:  Blood tests that check for low iron levels (anemia).  Fetal testing to check the health, activity level, and growth of the fetus. Testing is done if you have certain medical conditions or if there are problems during the  pregnancy.  Nonstress test (NST). This test checks the health of your baby to make sure there are no signs of problems, such as the baby not getting enough oxygen. During this test, a belt is placed around your belly. The baby is made to move, and its heart rate is monitored during movement.  What is false labor? False labor is a condition in which you feel small, irregular tightenings of the muscles in the womb (contractions) that usually go away with rest, changing position, or drinking water. These are called Braxton Hicks contractions. Contractions may last for hours, days, or even weeks before true labor sets in. If contractions come at regular intervals, become more frequent, increase in intensity, or become painful, you should see your health care provider. What are the signs of labor?  Abdominal cramps.  Regular contractions that start at 10 minutes apart and become stronger and more frequent with time.  Contractions that start on the top of the uterus and spread down to the lower abdomen and back.  Increased pelvic pressure and dull back pain.  A watery or bloody mucus discharge that comes from the vagina.  Leaking of amniotic fluid. This is also known as your "water breaking." It could be a slow trickle or a gush. Let your health care provider know if it has a color or strange odor. If you have any of these signs, call your health care provider right away, even if it is before your due date. Follow these instructions at home: Medicines  Follow your health care provider's instructions regarding medicine use. Specific medicines may be either safe or unsafe to take during pregnancy.  Take a prenatal vitamin that contains at least 600 micrograms (mcg) of folic acid.  If you develop constipation, try taking a stool softener if your health care provider approves. Eating and drinking  Eat a balanced diet that includes fresh fruits and vegetables, whole grains, good sources of protein  such as meat, eggs, or tofu, and low-fat dairy. Your health care provider will help you determine the amount of weight gain that is right for you.  Avoid raw meat and uncooked cheese. These carry germs that can cause birth defects in the baby.  If you have low calcium intake from food, talk to your health care provider about whether you should take a daily calcium supplement.  Eat four or five small meals rather than three large meals a day.  Limit foods that are high in fat and processed sugars, such as fried and sweet foods.  To prevent constipation: ? Drink enough fluid to keep your urine clear or pale yellow. ? Eat foods that are high in fiber, such as fresh fruits and vegetables, whole grains, and beans. Activity  Exercise only as directed by your health care provider. Most women can continue their usual exercise routine during pregnancy. Try to exercise for 30 minutes at least 5 days a week. Stop exercising if you experience uterine contractions.  Avoid heavy   lifting.  Do not exercise in extreme heat or humidity, or at high altitudes.  Wear low-heel, comfortable shoes.  Practice good posture.  You may continue to have sex unless your health care provider tells you otherwise. Relieving pain and discomfort  Take frequent breaks and rest with your legs elevated if you have leg cramps or low back pain.  Take warm sitz baths to soothe any pain or discomfort caused by hemorrhoids. Use hemorrhoid cream if your health care provider approves.  Wear a good support bra to prevent discomfort from breast tenderness.  If you develop varicose veins: ? Wear support pantyhose or compression stockings as told by your healthcare provider. ? Elevate your feet for 15 minutes, 3-4 times a day. Prenatal care  Write down your questions. Take them to your prenatal visits.  Keep all your prenatal visits as told by your health care provider. This is important. Safety  Wear your seat belt at  all times when driving.  Make a list of emergency phone numbers, including numbers for family, friends, the hospital, and police and fire departments. General instructions  Avoid cat litter boxes and soil used by cats. These carry germs that can cause birth defects in the baby. If you have a cat, ask someone to clean the litter box for you.  Do not travel far distances unless it is absolutely necessary and only with the approval of your health care provider.  Do not use hot tubs, steam rooms, or saunas.  Do not drink alcohol.  Do not use any products that contain nicotine or tobacco, such as cigarettes and e-cigarettes. If you need help quitting, ask your health care provider.  Do not use any medicinal herbs or unprescribed drugs. These chemicals affect the formation and growth of the baby.  Do not douche or use tampons or scented sanitary pads.  Do not cross your legs for long periods of time.  To prepare for the arrival of your baby: ? Take prenatal classes to understand, practice, and ask questions about labor and delivery. ? Make a trial run to the hospital. ? Visit the hospital and tour the maternity area. ? Arrange for maternity or paternity leave through employers. ? Arrange for family and friends to take care of pets while you are in the hospital. ? Purchase a rear-facing car seat and make sure you know how to install it in your car. ? Pack your hospital bag. ? Prepare the baby's nursery. Make sure to remove all pillows and stuffed animals from the baby's crib to prevent suffocation.  Visit your dentist if you have not gone during your pregnancy. Use a soft toothbrush to brush your teeth and be gentle when you floss. Contact a health care provider if:  You are unsure if you are in labor or if your water has broken.  You become dizzy.  You have mild pelvic cramps, pelvic pressure, or nagging pain in your abdominal area.  You have lower back pain.  You have persistent  nausea, vomiting, or diarrhea.  You have an unusual or bad smelling vaginal discharge.  You have pain when you urinate. Get help right away if:  Your water breaks before 37 weeks.  You have regular contractions less than 5 minutes apart before 37 weeks.  You have a fever.  You are leaking fluid from your vagina.  You have spotting or bleeding from your vagina.  You have severe abdominal pain or cramping.  You have rapid weight loss or weight gain.    You have shortness of breath with chest pain.  You notice sudden or extreme swelling of your face, hands, ankles, feet, or legs.  Your baby makes fewer than 10 movements in 2 hours.  You have severe headaches that do not go away when you take medicine.  You have vision changes. Summary  The third trimester is from week 28 through week 40, months 7 through 9. The third trimester is a time when the unborn baby (fetus) is growing rapidly.  During the third trimester, your discomfort may increase as you and your baby continue to gain weight. You may have abdominal, leg, and back pain, sleeping problems, and an increased need to urinate.  During the third trimester your breasts will keep growing and they will continue to become tender. A yellow fluid (colostrum) may leak from your breasts. This is the first milk you are producing for your baby.  False labor is a condition in which you feel small, irregular tightenings of the muscles in the womb (contractions) that eventually go away. These are called Braxton Hicks contractions. Contractions may last for hours, days, or even weeks before true labor sets in.  Signs of labor can include: abdominal cramps; regular contractions that start at 10 minutes apart and become stronger and more frequent with time; watery or bloody mucus discharge that comes from the vagina; increased pelvic pressure and dull back pain; and leaking of amniotic fluid. This information is not intended to replace advice  given to you by your health care provider. Make sure you discuss any questions you have with your health care provider. Document Released: 03/21/2001 Document Revised: 09/02/2015 Document Reviewed: 05/28/2012 Elsevier Interactive Patient Education  2017 [employer]  

## 2017-06-06 ENCOUNTER — Other Ambulatory Visit: Payer: Medicaid Other

## 2017-06-07 LAB — 28 WEEK RH+PANEL
BASOS ABS: 0 10*3/uL (ref 0.0–0.2)
Basos: 0 %
EOS (ABSOLUTE): 0.1 10*3/uL (ref 0.0–0.4)
Eos: 1 %
Gestational Diabetes Screen: 148 mg/dL — ABNORMAL HIGH (ref 65–139)
HEMATOCRIT: 27.4 % — AB (ref 34.0–46.6)
HIV SCREEN 4TH GENERATION: NONREACTIVE
Hemoglobin: 9.3 g/dL — ABNORMAL LOW (ref 11.1–15.9)
Immature Grans (Abs): 0 10*3/uL (ref 0.0–0.1)
Immature Granulocytes: 0 %
LYMPHS ABS: 0.6 10*3/uL — AB (ref 0.7–3.1)
Lymphs: 11 %
MCH: 32.2 pg (ref 26.6–33.0)
MCHC: 33.9 g/dL (ref 31.5–35.7)
MCV: 95 fL (ref 79–97)
MONOCYTES: 11 %
Monocytes Absolute: 0.6 10*3/uL (ref 0.1–0.9)
NEUTROS ABS: 4 10*3/uL (ref 1.4–7.0)
Neutrophils: 77 %
PLATELETS: 162 10*3/uL (ref 150–379)
RBC: 2.89 x10E6/uL — ABNORMAL LOW (ref 3.77–5.28)
RDW: 13.1 % (ref 12.3–15.4)
RPR Ser Ql: NONREACTIVE
WBC: 5.3 10*3/uL (ref 3.4–10.8)

## 2017-06-07 LAB — VARICELLA ZOSTER ANTIBODY, IGG: Varicella zoster IgG: >4000 index (ref >165)

## 2017-06-12 ENCOUNTER — Other Ambulatory Visit: Payer: Self-pay | Admitting: Obstetrics and Gynecology

## 2017-06-12 DIAGNOSIS — R7309 Other abnormal glucose: Secondary | ICD-10-CM

## 2017-06-12 NOTE — Progress Notes (Signed)
Called, no answer and left voicemail that she will need 3 hour GTT. Please try and reach her again so we can schedule this test. I will place an order and notify the front desk as well. Thank you! Dr. Jerene PitchSchuman

## 2017-06-12 NOTE — Progress Notes (Signed)
Patient returned phone call. Asked her to call office to schedule 3 hour GTT this week.

## 2017-06-14 ENCOUNTER — Telehealth: Payer: Self-pay

## 2017-06-14 NOTE — Telephone Encounter (Signed)
Pt called with questions regarding how to get iron level up and work on sugar intake. Advised to take PNV and OTC iron as prescribed twice daily. Also encouraged leafy greens and iron rich diet to help with absorption. Pt can take colace if stomach upset or constipation.   Pt also asking about vaccine she is to receive at her 30 wk visit and vaccine post partum. Informed pt about Tdap vaccine and non-immune to [place] so needs MMR post partum.   Pt aware and appreciative.

## 2017-06-15 ENCOUNTER — Other Ambulatory Visit: Payer: Self-pay | Admitting: Obstetrics and Gynecology

## 2017-06-15 ENCOUNTER — Telehealth: Payer: Self-pay

## 2017-06-15 ENCOUNTER — Other Ambulatory Visit: Payer: Medicaid Other

## 2017-06-15 DIAGNOSIS — R110 Nausea: Secondary | ICD-10-CM

## 2017-06-15 DIAGNOSIS — R7309 Other abnormal glucose: Secondary | ICD-10-CM

## 2017-06-15 MED ORDER — ONDANSETRON HCL 4 MG PO TABS: 4.0000 mg | ORAL_TABLET | Freq: Once | ORAL | 0 refills | Status: AC

## 2017-06-15 NOTE — Telephone Encounter (Signed)
Left msg for pt that rx sent in.  

## 2017-06-15 NOTE — Telephone Encounter (Signed)
I sent the prescription. Please notify patient. Thank you.

## 2017-06-15 NOTE — Telephone Encounter (Signed)
Pt had a question about her sugar levels. She was doing some research and was told the level is 140 & hers was 143. She is inquiring if she was 43 over or just 3 over. [phone]#[phone]

## 2017-06-15 NOTE — Telephone Encounter (Signed)
LMVM to notify pt her level was 148 (8 over normal limit). Pt advised to contact us with any further questions/concerns.

## 2017-06-15 NOTE — Telephone Encounter (Signed)
Pt calling states her [facility] at [facility] [address]. Something to help her not vomit during her glucose test.

## 2017-06-15 NOTE — Telephone Encounter (Signed)
Please advise. Thank you

## 2017-06-18 ENCOUNTER — Other Ambulatory Visit: Payer: Medicaid Other

## 2017-06-19 ENCOUNTER — Encounter: Payer: Self-pay | Admitting: Obstetrics and Gynecology

## 2017-06-19 ENCOUNTER — Ambulatory Visit (INDEPENDENT_AMBULATORY_CARE_PROVIDER_SITE_OTHER): Payer: Medicaid Other

## 2017-06-19 ENCOUNTER — Ambulatory Visit (INDEPENDENT_AMBULATORY_CARE_PROVIDER_SITE_OTHER): Payer: Medicaid Other | Admitting: Obstetrics and Gynecology

## 2017-06-19 VITALS — BP 118/74 | Wt 122.0 lb

## 2017-06-19 DIAGNOSIS — F411 Generalized anxiety disorder: Secondary | ICD-10-CM

## 2017-06-19 DIAGNOSIS — Z3A28 28 weeks gestation of pregnancy: Secondary | ICD-10-CM

## 2017-06-19 DIAGNOSIS — Z23 Encounter for immunization: Secondary | ICD-10-CM | POA: Diagnosis not present

## 2017-06-19 DIAGNOSIS — Z3A30 30 weeks gestation of pregnancy: Secondary | ICD-10-CM

## 2017-06-19 DIAGNOSIS — Z283 Underimmunization status: Secondary | ICD-10-CM

## 2017-06-19 DIAGNOSIS — O35EXX0 Maternal care for other (suspected) fetal abnormality and damage, fetal genitourinary anomalies, not applicable or unspecified: Secondary | ICD-10-CM

## 2017-06-19 DIAGNOSIS — O9989 Other specified diseases and conditions complicating pregnancy, childbirth and the puerperium: Secondary | ICD-10-CM

## 2017-06-19 DIAGNOSIS — O358XX0 Maternal care for other (suspected) fetal abnormality and damage, not applicable or unspecified: Secondary | ICD-10-CM | POA: Diagnosis not present

## 2017-06-19 DIAGNOSIS — D649 Anemia, unspecified: Secondary | ICD-10-CM

## 2017-06-19 DIAGNOSIS — Z3400 Encounter for supervision of normal first pregnancy, unspecified trimester: Secondary | ICD-10-CM

## 2017-06-19 DIAGNOSIS — O09899 Supervision of other high risk pregnancies, unspecified trimester: Secondary | ICD-10-CM

## 2017-06-19 LAB — GESTATIONAL GLUCOSE TOLERANCE
Glucose, Fasting: 75 mg/dL (ref 65–94)
Glucose, GTT - 1 Hour: 123 mg/dL (ref 65–179)
Glucose, GTT - 2 Hour: 103 mg/dL (ref 65–154)
Glucose, GTT - 3 Hour: 60 mg/dL — ABNORMAL LOW (ref 65–139)

## 2017-06-19 NOTE — Progress Notes (Signed)
Routine Prenatal Care Visit  Subjective  Gloria Morris is a 27 y.o. G1P0 at 4478w1d being seen today for ongoing prenatal care.  She is currently monitored for the following issues for this low-risk pregnancy and has Generalized anxiety disorder; OCD (obsessive compulsive disorder); Asperger's disorder; Ingrown right big toenail; Contraception management; Bacterial vaginosis; Urinary tract infection; Rubella non-immune status, antepartum; Supervision of normal first pregnancy, antepartum; and Anemia on their problem list.  ----------------------------------------------------------------------------------- Patient reports no complaints.   Contractions: Not present. Vag. Bleeding: None.  Movement: Present. Denies leaking of fluid.  U/S 54th%ile growht, afi normal.  No obvious renal pyelectasis.   ----------------------------------------------------------------------------------- The following portions of the patient's history were reviewed and updated as appropriate: allergies, current medications, past family history, past medical history, past social history, past surgical history and problem list. Problem list updated.   Objective  Blood pressure 118/74, weight 122 lb (55.3 kg). Pregravid weight 101 lb (45.8 kg) Total Weight Gain 21 lb (9.526 kg) Urinalysis:      Fetal Status: Fetal Heart Rate (bpm): Present   Movement: Present     General:  Alert, oriented and cooperative. Patient is in no acute distress.  Skin: Skin is warm and dry. No rash noted.   Cardiovascular: Normal heart rate noted  Respiratory: Normal respiratory effort, no problems with respiration noted  Abdomen: Soft, gravid, appropriate for gestational age. Pain/Pressure: Absent     Pelvic:  Cervical exam deferred        Extremities: Normal range of motion.     Mental Status: Normal mood and affect. Normal behavior. Normal judgment and thought content.   Assessment   27 y.o. G1P0 at 878w1d by  08/27/2017, by Other Basis  presenting for routine prenatal visit  Plan   pregancy Problems (from 05/23/17 to present)    Problem Noted Resolved   Supervision of normal first pregnancy, antepartum 05/28/2017 by Natale MilchSchuman, Christanna R, MD No   Overview Addendum 05/28/2017  4:02 PM by Natale MilchSchuman, Christanna R, MD      Clinic [facility] Prenatal Labs  Dating  20wk US Blood type: A/Positive/-- (01/21 0000)   Genetic Screen  NIPS:   pending Antibody:Negative (01/21 0000)  Anatomic US  Renal pyelectasis Rubella: Nonimmune (01/21 0000) Varicella:   GTT Early:        28 wk:      RPR: Nonreactive (01/21 0000)   Rhogam  not applicable HBsAg: Negative (01/21 0000)   TDaP vaccine                       HIV: Non-reactive (01/21 0000)   Flu Shot  Declines                              GBS:   Contraception  Nexplanon Pap: NIL 2019  CBB   Given information   CS/VBAC  not applicable   Baby Food  Breast   Support Person                 Preterm labor symptoms and general obstetric precautions including but not limited to vaginal bleeding, contractions, leaking of fluid and fetal movement were reviewed in detail with the patient. Please refer to After Visit Summary for other counseling recommendations.  - Anemia; samples of ferralet given.   Return in about 2 weeks (around 07/03/2017) for Routine Prenatal Appointment.  Thomasene MohairStephen Fe Okubo, MD, Merlinda FrederickFACOG [facility], [facility] 06/19/2017 11:46  AM

## 2017-06-21 NOTE — Progress Notes (Signed)
Please call patient and inform her that 3hr gtt is normal. She does not have gestational diabetes.  Thank you! Dr. Jerene PitchSchuman

## 2017-06-21 NOTE — Progress Notes (Signed)
No gestational diabetes

## 2017-06-29 ENCOUNTER — Telehealth: Payer: Self-pay

## 2017-06-29 ENCOUNTER — Ambulatory Visit (INDEPENDENT_AMBULATORY_CARE_PROVIDER_SITE_OTHER): Payer: Medicaid Other | Admitting: Obstetrics & Gynecology

## 2017-06-29 VITALS — BP 120/70 | Wt 119.0 lb

## 2017-06-29 DIAGNOSIS — O368130 Decreased fetal movements, third trimester, not applicable or unspecified: Secondary | ICD-10-CM

## 2017-06-29 NOTE — Telephone Encounter (Signed)
Pt called back to speak with [facility] nurse.

## 2017-06-29 NOTE — Telephone Encounter (Signed)
Pt called again for provider or nurse to call her back in regards to her questions she has.

## 2017-06-29 NOTE — Telephone Encounter (Signed)
Pt was just seen in the office at 11am,she is having concerns about the babies heart rate and she feels she is having palpitations also. Pt would like someone to contact her in regards to some questions she has but would like it to be confidential between her and Dr. Tiburcio PeaHarris only.

## 2017-06-29 NOTE — Progress Notes (Signed)
Work In, CC: DFM Date: 06/29/2017 Clinic: [facility]  Subjective:  Gloria Morris is a 27 y.o. G1P0 at 6062w4d being seen today for work in as she has concerns with DECREASED FETAL MOVEMENTS.  She is currently monitored for the following issues for this low-risk pregnancy and has Generalized anxiety disorder; OCD (obsessive compulsive disorder); Asperger's disorder; Ingrown right big toenail; Contraception management; Bacterial vaginosis; Urinary tract infection; Rubella non-immune status, antepartum; Supervision of normal first pregnancy, antepartum; and Anemia on their problem list.  Patient reports DFM since yesterday; no pain or bleeding or other sx's.  ANxious about not feeling move..   Contractions: Not present. Vag. Bleeding: None.  Movement: (!) Decreased. Denies leaking of fluid.   The following portions of the patient's history were reviewed and updated as appropriate: allergies, current medications, past family history, past medical history, past social history, past surgical history and problem list. Problem list updated.  Objective:   Vitals:   06/29/17 1056  BP: 120/70  Weight: 119 lb (54 kg)    Fetal Status: Fetal Heart Rate (bpm): 140   Movement: (!) Decreased     General:  Alert, oriented and cooperative. Patient is in no acute distress.  Skin: Skin is warm and dry. No rash noted.   Cardiovascular: Normal heart rate noted  Respiratory: Normal respiratory effort, no problems with respiration noted  Abdomen: Soft, gravid, appropriate for gestational age. Pain/Pressure: Absent     Pelvic:  Cervical exam deferred        Extremities: Normal range of motion.     Mental Status: Normal mood and affect. Normal behavior. Normal judgment and thought content.   Urinalysis: Urine Protein: Negative Urine Glucose: Negative  Assessment and Plan:  Pregnancy: G1P0 at 6562w4d  1. Decreased fetal movements in third trimester, single or unspecified fetus Reassured by FHT s today Counseled  to cont to monitor for FM. Consider US for AFI if continues to have low movement counts.    Last US on 06/19/17 showed AFI 15 Please refer to After Visit Summary for other counseling recommendations.  Return if symptoms worsen or fail to improve. A total of 15 minutes were spent face-to-face with the patient during this encounter and over half of that time dealt with counseling and coordination of care.  Annamarie MajorPaul Morgan Rennert, MD, Merlinda FrederickFACOG [facility], [facility] 06/29/2017  11:30 AM

## 2017-07-02 NOTE — Telephone Encounter (Signed)
Pt spoke c JP earlier this am.  Has question about work.  If she doesn't answer, she will call you back around 4pm.  [phone]

## 2017-07-02 NOTE — Telephone Encounter (Signed)
Pt states her mom comes with her to most of her visits and she does not want her mom to worry so she does not want any info from the previous appointment mentioned,when she come in for a rapid heart beat

## 2017-07-02 NOTE — Telephone Encounter (Signed)
Pt just calling stating she does not want her mom to know she came in because of the rapid heart bear, does not want her mom to worry,

## 2017-07-02 NOTE — Telephone Encounter (Signed)
Left message to return call 

## 2017-07-03 ENCOUNTER — Encounter: Payer: Self-pay | Admitting: Advanced Practice Midwife

## 2017-07-03 ENCOUNTER — Ambulatory Visit (INDEPENDENT_AMBULATORY_CARE_PROVIDER_SITE_OTHER): Payer: Medicaid Other | Admitting: Advanced Practice Midwife

## 2017-07-03 VITALS — BP 112/68 | Wt 122.0 lb

## 2017-07-03 DIAGNOSIS — Z3A32 32 weeks gestation of pregnancy: Secondary | ICD-10-CM

## 2017-07-03 NOTE — Telephone Encounter (Signed)
Pt seen in office today.

## 2017-07-03 NOTE — Progress Notes (Signed)
ROB

## 2017-07-03 NOTE — Patient Instructions (Signed)
Third Trimester of Pregnancy The third trimester is from week 28 through week 40 (months 7 through 9). The third trimester is a time when the unborn baby (fetus) is growing rapidly. At the end of the ninth month, the fetus is about 20 inches in length and weighs 6-10 pounds. Body changes during your third trimester Your body will continue to go through many changes during pregnancy. The changes vary from woman to woman. During the third trimester:  Your weight will continue to increase. You can expect to gain 25-35 pounds (11-16 kg) by the end of the pregnancy.  You may begin to get stretch marks on your hips, abdomen, and breasts.  You may urinate more often because the fetus is moving lower into your pelvis and pressing on your bladder.  You may develop or continue to have heartburn. This is caused by increased hormones that slow down muscles in the digestive tract.  You may develop or continue to have constipation because increased hormones slow digestion and cause the muscles that push waste through your intestines to relax.  You may develop hemorrhoids. These are swollen veins (varicose veins) in the rectum that can itch or be painful.  You may develop swollen, bulging veins (varicose veins) in your legs.  You may have increased body aches in the pelvis, back, or thighs. This is due to weight gain and increased hormones that are relaxing your joints.  You may have changes in your hair. These can include thickening of your hair, rapid growth, and changes in texture. Some women also have hair loss during or after pregnancy, or hair that feels dry or thin. Your hair will most likely return to normal after your baby is born.  Your breasts will continue to grow and they will continue to become tender. A yellow fluid (colostrum) may leak from your breasts. This is the first milk you are producing for your baby.  Your belly button may stick out.  You may notice more swelling in your hands,  face, or ankles.  You may have increased tingling or numbness in your hands, arms, and legs. The skin on your belly may also feel numb.  You may feel short of breath because of your expanding uterus.  You may have more problems sleeping. This can be caused by the size of your belly, increased need to urinate, and an increase in your body's metabolism.  You may notice the fetus "dropping," or moving lower in your abdomen (lightening).  You may have increased vaginal discharge.  You may notice your joints feel loose and you may have pain around your pelvic bone.  What to expect at prenatal visits You will have prenatal exams every 2 weeks until week 36. Then you will have weekly prenatal exams. During a routine prenatal visit:  You will be weighed to make sure you and the baby are growing normally.  Your blood pressure will be taken.  Your abdomen will be measured to track your baby's growth.  The fetal heartbeat will be listened to.  Any test results from the previous visit will be discussed.  You may have a cervical check near your due date to see if your cervix has softened or thinned (effaced).  You will be tested for Group B streptococcus. This happens between 35 and 37 weeks.  Your health care provider may ask you:  What your birth plan is.  How you are feeling.  If you are feeling the baby move.  If you have had   any abnormal symptoms, such as leaking fluid, bleeding, severe headaches, or abdominal cramping.  If you are using any tobacco products, including cigarettes, chewing tobacco, and electronic cigarettes.  If you have any questions.  Other tests or screenings that may be performed during your third trimester include:  Blood tests that check for low iron levels (anemia).  Fetal testing to check the health, activity level, and growth of the fetus. Testing is done if you have certain medical conditions or if there are problems during the  pregnancy.  Nonstress test (NST). This test checks the health of your baby to make sure there are no signs of problems, such as the baby not getting enough oxygen. During this test, a belt is placed around your belly. The baby is made to move, and its heart rate is monitored during movement.  What is false labor? False labor is a condition in which you feel small, irregular tightenings of the muscles in the womb (contractions) that usually go away with rest, changing position, or drinking water. These are called Braxton Hicks contractions. Contractions may last for hours, days, or even weeks before true labor sets in. If contractions come at regular intervals, become more frequent, increase in intensity, or become painful, you should see your health care provider. What are the signs of labor?  Abdominal cramps.  Regular contractions that start at 10 minutes apart and become stronger and more frequent with time.  Contractions that start on the top of the uterus and spread down to the lower abdomen and back.  Increased pelvic pressure and dull back pain.  A watery or bloody mucus discharge that comes from the vagina.  Leaking of amniotic fluid. This is also known as your "water breaking." It could be a slow trickle or a gush. Let your health care provider know if it has a color or strange odor. If you have any of these signs, call your health care provider right away, even if it is before your due date. Follow these instructions at home: Medicines  Follow your health care provider's instructions regarding medicine use. Specific medicines may be either safe or unsafe to take during pregnancy.  Take a prenatal vitamin that contains at least 600 micrograms (mcg) of folic acid.  If you develop constipation, try taking a stool softener if your health care provider approves. Eating and drinking  Eat a balanced diet that includes fresh fruits and vegetables, whole grains, good sources of protein  such as meat, eggs, or tofu, and low-fat dairy. Your health care provider will help you determine the amount of weight gain that is right for you.  Avoid raw meat and uncooked cheese. These carry germs that can cause birth defects in the baby.  If you have low calcium intake from food, talk to your health care provider about whether you should take a daily calcium supplement.  Eat four or five small meals rather than three large meals a day.  Limit foods that are high in fat and processed sugars, such as fried and sweet foods.  To prevent constipation: ? Drink enough fluid to keep your urine clear or pale yellow. ? Eat foods that are high in fiber, such as fresh fruits and vegetables, whole grains, and beans. Activity  Exercise only as directed by your health care provider. Most women can continue their usual exercise routine during pregnancy. Try to exercise for 30 minutes at least 5 days a week. Stop exercising if you experience uterine contractions.  Avoid heavy   lifting.  Do not exercise in extreme heat or humidity, or at high altitudes.  Wear low-heel, comfortable shoes.  Practice good posture.  You may continue to have sex unless your health care provider tells you otherwise. Relieving pain and discomfort  Take frequent breaks and rest with your legs elevated if you have leg cramps or low back pain.  Take warm sitz baths to soothe any pain or discomfort caused by hemorrhoids. Use hemorrhoid cream if your health care provider approves.  Wear a good support bra to prevent discomfort from breast tenderness.  If you develop varicose veins: ? Wear support pantyhose or compression stockings as told by your healthcare provider. ? Elevate your feet for 15 minutes, 3-4 times a day. Prenatal care  Write down your questions. Take them to your prenatal visits.  Keep all your prenatal visits as told by your health care provider. This is important. Safety  Wear your seat belt at  all times when driving.  Make a list of emergency phone numbers, including numbers for family, friends, the hospital, and police and fire departments. General instructions  Avoid cat litter boxes and soil used by cats. These carry germs that can cause birth defects in the baby. If you have a cat, ask someone to clean the litter box for you.  Do not travel far distances unless it is absolutely necessary and only with the approval of your health care provider.  Do not use hot tubs, steam rooms, or saunas.  Do not drink alcohol.  Do not use any products that contain nicotine or tobacco, such as cigarettes and e-cigarettes. If you need help quitting, ask your health care provider.  Do not use any medicinal herbs or unprescribed drugs. These chemicals affect the formation and growth of the baby.  Do not douche or use tampons or scented sanitary pads.  Do not cross your legs for long periods of time.  To prepare for the arrival of your baby: ? Take prenatal classes to understand, practice, and ask questions about labor and delivery. ? Make a trial run to the hospital. ? Visit the hospital and tour the maternity area. ? Arrange for maternity or paternity leave through employers. ? Arrange for family and friends to take care of pets while you are in the hospital. ? Purchase a rear-facing car seat and make sure you know how to install it in your car. ? Pack your hospital bag. ? Prepare the baby's nursery. Make sure to remove all pillows and stuffed animals from the baby's crib to prevent suffocation.  Visit your dentist if you have not gone during your pregnancy. Use a soft toothbrush to brush your teeth and be gentle when you floss. Contact a health care provider if:  You are unsure if you are in labor or if your water has broken.  You become dizzy.  You have mild pelvic cramps, pelvic pressure, or nagging pain in your abdominal area.  You have lower back pain.  You have persistent  nausea, vomiting, or diarrhea.  You have an unusual or bad smelling vaginal discharge.  You have pain when you urinate. Get help right away if:  Your water breaks before 37 weeks.  You have regular contractions less than 5 minutes apart before 37 weeks.  You have a fever.  You are leaking fluid from your vagina.  You have spotting or bleeding from your vagina.  You have severe abdominal pain or cramping.  You have rapid weight loss or weight gain.    You have shortness of breath with chest pain.  You notice sudden or extreme swelling of your face, hands, ankles, feet, or legs.  Your baby makes fewer than 10 movements in 2 hours.  You have severe headaches that do not go away when you take medicine.  You have vision changes. Summary  The third trimester is from week 28 through week 40, months 7 through 9. The third trimester is a time when the unborn baby (fetus) is growing rapidly.  During the third trimester, your discomfort may increase as you and your baby continue to gain weight. You may have abdominal, leg, and back pain, sleeping problems, and an increased need to urinate.  During the third trimester your breasts will keep growing and they will continue to become tender. A yellow fluid (colostrum) may leak from your breasts. This is the first milk you are producing for your baby.  False labor is a condition in which you feel small, irregular tightenings of the muscles in the womb (contractions) that eventually go away. These are called Braxton Hicks contractions. Contractions may last for hours, days, or even weeks before true labor sets in.  Signs of labor can include: abdominal cramps; regular contractions that start at 10 minutes apart and become stronger and more frequent with time; watery or bloody mucus discharge that comes from the vagina; increased pelvic pressure and dull back pain; and leaking of amniotic fluid. This information is not intended to replace advice  given to you by your health care provider. Make sure you discuss any questions you have with your health care provider. Document Released: 03/21/2001 Document Revised: 09/02/2015 Document Reviewed: 05/28/2012 Elsevier Interactive Patient Education  2017 [employer]  

## 2017-07-03 NOTE — Progress Notes (Signed)
Routine Prenatal Care Visit  Subjective  Gloria Morris is a 27 y.o. G1P0 at 5248w1d being seen today for ongoing prenatal care.  She is currently monitored for the following issues for this high-risk pregnancy and has Generalized anxiety disorder; OCD (obsessive compulsive disorder); Asperger's disorder; Ingrown right big toenail; Contraception management; Bacterial vaginosis; Urinary tract infection; Rubella non-immune status, antepartum; Supervision of normal first pregnancy, antepartum; and Anemia on their problem list.  ----------------------------------------------------------------------------------- Patient reports no complaints.  Baby was breech on u/s 2 weeks ago and she has not felt a change of position. Patient says she [place]. Reviewed progesterone-only BC options for breastfeeding Contractions: Not present. Vag. Bleeding: None.  Movement: Present. Denies leaking of fluid.  ----------------------------------------------------------------------------------- The following portions of the patient's history were reviewed and updated as appropriate: allergies, current medications, past family history, past medical history, past social history, past surgical history and problem list. Problem list updated.   Objective  Blood pressure 112/68, weight 122 lb (55.3 kg). Pregravid weight 101 lb (45.8 kg) Total Weight Gain 21 lb (9.526 kg) Urinalysis: Urine Protein: Negative Urine Glucose: Negative  Fetal Status: Fetal Heart Rate (bpm): 142 Fundal Height: 32 cm Movement: Present     Heart tones strongest in LLQ  General:  Alert, oriented and cooperative. Patient is in no acute distress.  Skin: Skin is warm and dry. No rash noted.   Cardiovascular: Normal heart rate noted  Respiratory: Normal respiratory effort, no problems with respiration noted  Abdomen: Soft, gravid, appropriate for gestational age. Pain/Pressure: Absent     Pelvic:  Cervical exam deferred        Extremities:  Normal range of motion.  Edema: None  Mental Status: Normal mood and affect. Normal behavior. Normal judgment and thought content.   Assessment   27 y.o. G1P0 at 148w1d by  08/27/2017, by Other Basis presenting for routine prenatal visit  Plan   pregancy Problems (from 05/23/17 to present)    Problem Noted Resolved   Supervision of normal first pregnancy, antepartum 05/28/2017 by Natale MilchSchuman, Christanna R, MD No   Overview Addendum 05/28/2017  4:02 PM by Natale MilchSchuman, Christanna R, MD      Clinic [facility] Prenatal Labs  Dating  20wk US Blood type: A/Positive/-- (01/21 0000)   Genetic Screen  NIPS:   pending Antibody:Negative (01/21 0000)  Anatomic US  Renal pyelectasis Rubella: Nonimmune (01/21 0000) Varicella:   GTT Early:        28 wk:      RPR: Nonreactive (01/21 0000)   Rhogam  not applicable HBsAg: Negative (01/21 0000)   TDaP vaccine                       HIV: Non-reactive (01/21 0000)   Flu Shot  Declines                              GBS:   Contraception  Nexplanon Pap: NIL 2019  CBB   Given information   CS/VBAC  not applicable   Baby Food  Breast   Support Person                 Preterm labor symptoms and general obstetric precautions including but not limited to vaginal bleeding, contractions, leaking of fluid and fetal movement were reviewed in detail with the patient. Please refer to After Visit Summary for other counseling recommendations.   Return in about 2  weeks (around 07/17/2017) for rob.  Tresea Mall, CNM 07/03/2017 10:37 AM

## 2017-07-11 ENCOUNTER — Ambulatory Visit: Payer: Self-pay | Admitting: Internal Medicine

## 2017-07-11 DIAGNOSIS — Z0289 Encounter for other administrative examinations: Secondary | ICD-10-CM

## 2017-07-12 ENCOUNTER — Ambulatory Visit: Payer: Medicaid Other | Admitting: Physician Assistant

## 2017-07-12 ENCOUNTER — Encounter: Payer: Self-pay | Admitting: Physician Assistant

## 2017-07-12 VITALS — BP 110/70 | HR 78 | Temp 98.7°F | Resp 16 | Ht 65.0 in | Wt 123.4 lb

## 2017-07-12 DIAGNOSIS — Z3A32 32 weeks gestation of pregnancy: Secondary | ICD-10-CM | POA: Diagnosis not present

## 2017-07-12 DIAGNOSIS — L409 Psoriasis, unspecified: Secondary | ICD-10-CM | POA: Diagnosis not present

## 2017-07-12 MED ORDER — FLUOCINOLONE ACETONIDE 0.01 % EX CREA: TOPICAL_CREAM | Freq: Two times a day (BID) | CUTANEOUS | 1 refills | Status: DC

## 2017-07-12 MED ORDER — FLUOCINOLONE ACETONIDE 0.01 % EX SHAM: MEDICATED_SHAMPOO | CUTANEOUS | 1 refills | Status: DC

## 2017-07-12 NOTE — Progress Notes (Deleted)
       Patient: Gloria Morris Female    DOB: 02/08/1991   [age] y.o.   MRN: [id number] Visit Date: 07/12/2017  Today's Provider: Margaretann LovelessJennifer M Burnette, PA-C   No chief complaint on file.  Subjective:    HPI Patient is new to the practice here to establish care.     No Known Allergies   Current Outpatient Medications:  .  docusate sodium (COLACE) 100 MG capsule, Take 1 capsule (100 mg total) by mouth 2 (two) times daily as needed., Disp: 30 capsule, Rfl: 2 .  ferrous sulfate (FERROUSUL) 325 (65 FE) MG tablet, Take 1 tablet (325 mg total) by mouth 2 (two) times daily., Disp: 60 tablet, Rfl: 1 .  Prenatal Vit-Fe Fumarate-FA (PRENATAL MULTIVITAMIN) TABS tablet, Take 1 tablet by mouth daily at 12 noon., Disp: , Rfl:   Review of Systems  Constitutional: Negative.   HENT: Negative.   Eyes: Negative.   Respiratory: Negative.   Cardiovascular: Negative.   Gastrointestinal: Negative.   Endocrine: Negative.   Genitourinary: Negative.   Musculoskeletal: Negative.   Skin: Negative.   Allergic/Immunologic: Negative.   Neurological: Negative.   Hematological: Negative.   Psychiatric/Behavioral: Negative.    Depression screen PHQ 2/9 07/12/2017  Decreased Interest 0  Down, Depressed, Hopeless 0  PHQ - 2 Score 0    Social History   Tobacco Use  . Smoking status: Former Games developermoker  . Smokeless tobacco: Never Used  Substance Use Topics  . Alcohol use: No   Objective:   There were no vitals taken for this visit.   Physical Exam      Assessment & Plan:           Margaretann LovelessJennifer M Burnette, PA-C  [facility]

## 2017-07-12 NOTE — Progress Notes (Signed)
Patient: Gloria Morris Female    DOB: 01/05/1991   27 y.o.   MRN: [id number] Visit Date: 07/12/2017  Today's Provider: Margaretann LovelessJennifer M Burnette, PA-C   Chief Complaint  Patient presents with  . New Patient (Initial Visit)  . Psoriasis   Subjective:    HPI  Patient here today to establish care. Recently moved from [place]. Previously followed by Dr. Lucretia Roersowers.   Patient reports she has a history of psoriasis, patient reports she usually has flare ups on elbows, knees, scalp and on foot. Patient reports this is hereditary and reports that her maternal grandmother, mother and her sister have psoriasis. Patient reports she has been using Fluocinolone 0.01% oil on and off since about 10 years.   [redacted] weeks pregnant, [facility] follows her.     No Known Allergies   Current Outpatient Medications:  .  docusate sodium (COLACE) 100 MG capsule, Take 1 capsule (100 mg total) by mouth 2 (two) times daily as needed., Disp: 30 capsule, Rfl: 2 .  ferrous sulfate (FERROUSUL) 325 (65 FE) MG tablet, Take 1 tablet (325 mg total) by mouth 2 (two) times daily., Disp: 60 tablet, Rfl: 1 .  Prenatal Vit-Fe Fumarate-FA (PRENATAL MULTIVITAMIN) TABS tablet, Take 1 tablet by mouth daily at 12 noon., Disp: , Rfl:   Review of Systems  Constitutional: Negative.   HENT: Negative.   Eyes: Negative.   Respiratory: Negative.   Cardiovascular: Negative.   Gastrointestinal: Negative.   Endocrine: Negative.   Genitourinary: Negative.   Musculoskeletal: Negative.   Skin: Positive for rash.  Allergic/Immunologic: Negative.   Neurological: Negative.   Hematological: Negative.   Psychiatric/Behavioral: Negative.     Social History   Tobacco Use  . Smoking status: Former Games developermoker  . Smokeless tobacco: Never Used  Substance Use Topics  . Alcohol use: No   Objective:   BP 110/70 (BP Location: Left Arm, Patient Position: Sitting, Cuff Size: Normal)   Pulse 78   Temp 98.7 F (37.1 C)  (Oral)   Resp 16   Ht 5\' 5"  (1.651 m)   Wt 123 lb 6.4 oz (56 kg)   SpO2 98%   BMI 20.53 kg/m  Vitals:   07/12/17 0916  BP: 110/70  Pulse: 78  Resp: 16  Temp: 98.7 F (37.1 C)  TempSrc: Oral  SpO2: 98%  Weight: 123 lb 6.4 oz (56 kg)  Height: 5\' 5"  (1.651 m)     Physical Exam  Constitutional: She appears well-developed and well-nourished. No distress.  Neck: Normal range of motion. Neck supple. No tracheal deviation present. No thyromegaly present.  Cardiovascular: Normal rate, regular rhythm and normal heart sounds. Exam reveals no gallop and no friction rub.  No murmur heard. Pulmonary/Chest: Effort normal and breath sounds normal. No respiratory distress. She has no wheezes. She has no rales.  Lymphadenopathy:    She has no cervical adenopathy.  Skin: She is not diaphoretic.  Vitals reviewed.      Assessment & Plan:     1. Encounter to establish care Recently moved here in February 2019. Previous PCP was Dr. Lucretia Roersowers.  2. Psoriasis Has had psoriasis since 9th grade. Well controlled on Fluocinolone. Will prescribe as below.  - Fluocinolone Acetonide 0.01 % SHAM; Apply topically once to twice daily as needed  Dispense: 120 mL; Refill: 1 - fluocinolone (VANOS) 0.01 % cream; Apply topically 2 (two) times daily.  Dispense: 60 g; Refill: 1  3. [redacted] weeks gestation of pregnancy  Followed by [facility]. Normal pregnancy thus far.        Margaretann Loveless, PA-C  [facility]

## 2017-07-12 NOTE — Progress Notes (Signed)
Please change her PCP- [facility] Thanks !

## 2017-07-13 ENCOUNTER — Telehealth: Payer: Self-pay

## 2017-07-13 NOTE — Telephone Encounter (Signed)
Patient called saying that her creme needs a prior auth. She reports that the shampoo also needs a PA. Just FYI. Thanks!

## 2017-07-16 ENCOUNTER — Ambulatory Visit (INDEPENDENT_AMBULATORY_CARE_PROVIDER_SITE_OTHER): Payer: Medicaid Other | Admitting: Obstetrics and Gynecology

## 2017-07-16 ENCOUNTER — Telehealth: Payer: Self-pay

## 2017-07-16 VITALS — BP 118/62 | Wt 123.0 lb

## 2017-07-16 DIAGNOSIS — Z283 Underimmunization status: Secondary | ICD-10-CM

## 2017-07-16 DIAGNOSIS — Z3400 Encounter for supervision of normal first pregnancy, unspecified trimester: Secondary | ICD-10-CM

## 2017-07-16 DIAGNOSIS — Z3A34 34 weeks gestation of pregnancy: Secondary | ICD-10-CM

## 2017-07-16 DIAGNOSIS — O09899 Supervision of other high risk pregnancies, unspecified trimester: Secondary | ICD-10-CM

## 2017-07-16 DIAGNOSIS — O9989 Other specified diseases and conditions complicating pregnancy, childbirth and the puerperium: Secondary | ICD-10-CM

## 2017-07-16 NOTE — Progress Notes (Signed)
Pt reports feeling better taking ferralet 90.

## 2017-07-16 NOTE — Progress Notes (Signed)
    Routine Prenatal Care Visit  Subjective  Gloria Morris is a 27 y.o. G1P0 at 4835w0d being seen today for ongoing prenatal care.  She is currently monitored for the following issues for this low-risk pregnancy and has Generalized anxiety disorder; OCD (obsessive compulsive disorder); Asperger's disorder; Ingrown right big toenail; Contraception management; Bacterial vaginosis; Urinary tract infection; Rubella non-immune status, antepartum; Supervision of normal first pregnancy, antepartum; and Anemia on their problem list.  ----------------------------------------------------------------------------------- Patient reports no complaints.  Patient is doing well and attending prenatal classes with the hospital.  Contractions: Not present. Vag. Bleeding: None.  Movement: Present. Denies leaking of fluid.  ----------------------------------------------------------------------------------- The following portions of the patient's history were reviewed and updated as appropriate: allergies, current medications, past family history, past medical history, past social history, past surgical history and problem list. Problem list updated.   Objective  Blood pressure 118/62, weight 123 lb (55.8 kg). Pregravid weight 101 lb (45.8 kg) Total Weight Gain 22 lb (9.979 kg) Urinalysis: Urine Protein: Negative Urine Glucose: Negative  Fetal Status: Fetal Heart Rate (bpm): 140 Fundal Height: 33 cm Movement: Present  Presentation: Vertex  General:  Alert, oriented and cooperative. Patient is in no acute distress.  Skin: Skin is warm and dry. No rash noted.   Cardiovascular: Normal heart rate noted  Respiratory: Normal respiratory effort, no problems with respiration noted  Abdomen: Soft, gravid, appropriate for gestational age. Pain/Pressure: Absent     Pelvic:  Cervical exam deferred        Extremities: Normal range of motion.     ental Status: Normal mood and affect. Normal behavior. Normal judgment  and thought content.     Assessment   27 y.o. G1P0 at 11035w0d by  08/27/2017, by Other Basis presenting for routine prenatal visit  Plan   pregancy Problems (from 05/23/17 to present)    Problem Noted Resolved   Supervision of normal first pregnancy, antepartum 05/28/2017 by Natale MilchSchuman, Christanna R, MD No   Overview Addendum 07/16/2017  8:30 AM by Natale MilchSchuman, Christanna R, MD      Clinic [facility] Prenatal Labs  Dating  20wk US Blood type: A/Positive/-- (01/21 0000)   Genetic Screen  NIPS:   pending Antibody:Negative (01/21 0000)  Anatomic US  Renal pyelectasis Rubella: Nonimmune (01/21 0000) Varicella:   GTT Early:        3hr:  passed    RPR: Nonreactive (01/21 0000)   Rhogam  not applicable HBsAg: Negative (01/21 0000)   TDaP vaccine   06/19/17                    HIV: Non-reactive (01/21 0000)   Flu Shot  Declines                              GBS:   Contraception  Nexplanon? Pap: NIL 2019  CBB   Given information   CS/VBAC  not applicable   Baby Food  Breast   Support Person                 Gestational age appropriate obstetric precautions including but not limited to vaginal bleeding, contractions, leaking of fluid and fetal movement were reviewed in detail with the patient.    Return in about 1 week (around 07/23/2017) for ROB.  Adelene Idlerhristanna Schuman MD [facility], [facility] 07/16/2017, 8:32 AM

## 2017-07-16 NOTE — Telephone Encounter (Signed)
LMTCB  Thanks,  -Kieren Ricci 

## 2017-07-16 NOTE — Telephone Encounter (Signed)
Spoke with patient regarding the PA. [facility] Advised patient once I hear from the pharmacy and [facility] I will call her.  Thanks,  -Joseline

## 2017-07-16 NOTE — Telephone Encounter (Signed)
Please see other note. Thanks,  -Joseline 

## 2017-07-17 ENCOUNTER — Ambulatory Visit: Payer: Medicaid Other | Admitting: Family Medicine

## 2017-07-17 ENCOUNTER — Encounter: Payer: Self-pay | Admitting: Family Medicine

## 2017-07-17 ENCOUNTER — Telehealth: Payer: Self-pay

## 2017-07-17 VITALS — BP 108/60 | HR 87 | Temp 97.6°F | Resp 16 | Wt 123.0 lb

## 2017-07-17 DIAGNOSIS — A084 Viral intestinal infection, unspecified: Secondary | ICD-10-CM

## 2017-07-17 MED ORDER — ONDANSETRON HCL 4 MG PO TABS: 4.0000 mg | ORAL_TABLET | Freq: Three times a day (TID) | ORAL | 0 refills | Status: DC | PRN

## 2017-07-17 NOTE — Progress Notes (Signed)
Patient: Gloria Morris Female    DOB: 08/21/1990   27 y.o.   MRN: [id number] Visit Date: 07/17/2017  Today's Provider: Shirlee Latch, MD   I, Joslyn Hy, CMA, am acting as scribe for Shirlee Latch, MD.  Chief Complaint  Patient presents with  . Emesis   Subjective:    Emesis   This is a new problem. The current episode started yesterday. Episode frequency: every 20 minutes. The emesis has an appearance of stomach contents. There has been no fever. Associated symptoms include arthralgias, coughing, diarrhea, dizziness, headaches, myalgias and sweats. Pertinent negatives include no abdominal pain, chest pain, chills or fever. She has tried acetaminophen for the symptoms. The treatment provided no relief.  Pt is concerned of possible GI bug vs food poisoning. States she does eat out a lot.     No Known Allergies   Current Outpatient Medications:  .  docusate sodium (COLACE) 100 MG capsule, Take 1 capsule (100 mg total) by mouth 2 (two) times daily as needed., Disp: 30 capsule, Rfl: 2 .  ferrous sulfate (FERROUSUL) 325 (65 FE) MG tablet, Take 1 tablet (325 mg total) by mouth 2 (two) times daily., Disp: 60 tablet, Rfl: 1 .  Prenatal Vit-Fe Fumarate-FA (PRENATAL MULTIVITAMIN) TABS tablet, Take 1 tablet by mouth daily at 12 noon., Disp: , Rfl:  .  fluocinolone (VANOS) 0.01 % cream, Apply topically 2 (two) times daily. (Patient not taking: Reported on 07/17/2017), Disp: 60 g, Rfl: 1 .  Fluocinolone Acetonide 0.01 % SHAM, Apply topically once to twice daily as needed (Patient not taking: Reported on 07/17/2017), Disp: 120 mL, Rfl: 1  Review of Systems  Constitutional: Negative for chills and fever.  Respiratory: Positive for cough.   Cardiovascular: Negative for chest pain.  Gastrointestinal: Positive for diarrhea and vomiting. Negative for abdominal pain.  Musculoskeletal: Positive for arthralgias and myalgias.  Neurological: Positive for dizziness and headaches.     Social History   Tobacco Use  . Smoking status: Former Games developer  . Smokeless tobacco: Never Used  Substance Use Topics  . Alcohol use: No   Objective:   BP 108/60 (BP Location: Left Arm, Patient Position: Sitting, Cuff Size: Normal)   Pulse 87   Temp 97.6 F (36.4 C) (Oral)   Resp 16   Wt 123 lb (55.8 kg)   SpO2 99%   BMI 20.47 kg/m  Vitals:   07/17/17 1123  BP: 108/60  Pulse: 87  Resp: 16  Temp: 97.6 F (36.4 C)  TempSrc: Oral  SpO2: 99%  Weight: 123 lb (55.8 kg)     Physical Exam  Constitutional: She is oriented to person, place, and time. She appears well-developed and well-nourished. No distress.  HENT:  Head: Normocephalic and atraumatic.  Eyes: Conjunctivae are normal. No scleral icterus.  Cardiovascular: Normal rate, regular rhythm, normal heart sounds and intact distal pulses.  No murmur heard. Pulmonary/Chest: Effort normal and breath sounds normal. No respiratory distress. She has no wheezes.  Abdominal: There is no tenderness.  gravid  Musculoskeletal: She exhibits no edema.  Neurological: She is alert and oriented to person, place, and time.  Skin: Skin is warm and dry. Capillary refill takes less than 2 seconds.  Psychiatric: She has a normal mood and affect. Her behavior is normal.  Vitals reviewed.       Assessment & Plan:   1. Viral gastroenteritis - likely viral gastroenteritis - ok to use Zofran sparingly for N/V - discussed symptomatic management, staying  hydrated, natural course, and return precautions    Meds ordered this encounter  Medications  . ondansetron (ZOFRAN) 4 MG tablet    Sig: Take 1 tablet (4 mg total) by mouth every 8 (eight) hours as needed for nausea or vomiting.    Dispense:  20 tablet    Refill:  0     Return if symptoms worsen or fail to improve.   The entirety of the information documented in the History of Present Illness, Review of Systems and Physical Exam were personally obtained by me. Portions of  this information were initially documented by Irving BurtonEmily Ratchford, CMA and reviewed by me for thoroughness and accuracy.    Erasmo DownerBacigalupo, Angela M, MD, MPH [facility] 07/17/2017 11:48 AM

## 2017-07-17 NOTE — Patient Instructions (Signed)

## 2017-07-17 NOTE — Telephone Encounter (Signed)
Patient called to see if PA was done for the shampoo and the cream. She reports that she contacted the pharmacy and it is still showing a rejection. Advised patient that this could take up to 72hrs to process. We will call her once we hear from the insurance.  She then asks if the oil will be better than the creme?? Please advise. Thanks!

## 2017-07-18 NOTE — Telephone Encounter (Signed)
Patient dropped off a copy of the medication rejection forms from pharmacy, wants to know if she can get another medication. She is concerned that she has been without medicine for aver a week now. - I placed what she dropped of in your folder. Please advise

## 2017-07-19 ENCOUNTER — Other Ambulatory Visit: Payer: Self-pay | Admitting: Physician Assistant

## 2017-07-19 ENCOUNTER — Telehealth: Payer: Self-pay

## 2017-07-19 DIAGNOSIS — L409 Psoriasis, unspecified: Secondary | ICD-10-CM

## 2017-07-19 MED ORDER — FLUOCINOLONE ACETONIDE SCALP 0.01 % EX OIL: TOPICAL_OIL | CUTANEOUS | 3 refills | Status: DC

## 2017-07-19 NOTE — Telephone Encounter (Signed)
Patient called office concerning PA for cream. Patient wanted to know if it was approved or if she can have an rx for something else? Please advise?

## 2017-07-19 NOTE — Progress Notes (Signed)
Sent in fluocinolone oil for scalp

## 2017-07-19 NOTE — Telephone Encounter (Signed)
Called pharmacy and they report that the cream and the shampoo is still not approved. Just FYI. Thanks!

## 2017-07-19 NOTE — Telephone Encounter (Signed)
Gloria Morris,  Can we follow up about the cream?  Thanks, JB

## 2017-07-20 NOTE — Telephone Encounter (Signed)
Spoke with patient and advised that I did the PA for the cream and oil and it said Approved for both of them.  Thanks,  Joseline

## 2017-07-23 ENCOUNTER — Ambulatory Visit (INDEPENDENT_AMBULATORY_CARE_PROVIDER_SITE_OTHER): Payer: Medicaid Other | Admitting: Obstetrics and Gynecology

## 2017-07-23 ENCOUNTER — Encounter: Payer: Self-pay | Admitting: Obstetrics and Gynecology

## 2017-07-23 ENCOUNTER — Encounter: Payer: Medicaid Other | Admitting: Obstetrics and Gynecology

## 2017-07-23 VITALS — BP 114/64 | Wt 123.0 lb

## 2017-07-23 DIAGNOSIS — Z283 Underimmunization status: Secondary | ICD-10-CM

## 2017-07-23 DIAGNOSIS — Z3400 Encounter for supervision of normal first pregnancy, unspecified trimester: Secondary | ICD-10-CM

## 2017-07-23 DIAGNOSIS — O09899 Supervision of other high risk pregnancies, unspecified trimester: Secondary | ICD-10-CM

## 2017-07-23 DIAGNOSIS — Z3A35 35 weeks gestation of pregnancy: Secondary | ICD-10-CM

## 2017-07-23 DIAGNOSIS — O9989 Other specified diseases and conditions complicating pregnancy, childbirth and the puerperium: Secondary | ICD-10-CM

## 2017-07-23 DIAGNOSIS — F845 Asperger's syndrome: Secondary | ICD-10-CM

## 2017-07-23 NOTE — Progress Notes (Signed)
Pt c/o constipation and leg cramps.

## 2017-07-23 NOTE — Progress Notes (Signed)
    Routine Prenatal Care Visit  Subjective  Gloria Morris is a 27 y.o. G1P0 at 1947w0d being seen today for ongoing prenatal care.  She is currently monitored for the following issues for this low-risk pregnancy and has Generalized anxiety disorder; OCD (obsessive compulsive disorder); Asperger's disorder; Ingrown right big toenail; Contraception management; Bacterial vaginosis; Urinary tract infection; Rubella non-immune status, antepartum; Supervision of normal first pregnancy, antepartum; and Anemia on their problem list.  ----------------------------------------------------------------------------------- Patient reports no complaints.   Contractions: Not present. Vag. Bleeding: None.  Movement: Present. Denies leaking of fluid.  ----------------------------------------------------------------------------------- The following portions of the patient's history were reviewed and updated as appropriate: allergies, current medications, past family history, past medical history, past social history, past surgical history and problem list. Problem list updated.   Objective  Blood pressure 114/64, weight 123 lb (55.8 kg). Pregravid weight 101 lb (45.8 kg) Total Weight Gain 22 lb (9.979 kg) Urinalysis: Urine Protein: Negative Urine Glucose: Negative  Fetal Status: Fetal Heart Rate (bpm): 137 Fundal Height: 35 cm Movement: Present     General:  Alert, oriented and cooperative. Patient is in no acute distress.  Skin: Skin is warm and dry. No rash noted.   Cardiovascular: Normal heart rate noted  Respiratory: Normal respiratory effort, no problems with respiration noted  Abdomen: Soft, gravid, appropriate for gestational age. Pain/Pressure: Absent     Pelvic:  Cervical exam deferred        Extremities: Normal range of motion.     ental Status: Normal mood and affect. Normal behavior. Normal judgment and thought content.     Assessment   27 y.o. G1P0 at 4547w0d by  08/27/2017, by Other  Basis presenting for routine prenatal visit  Plan   pregancy Problems (from 05/23/17 to present)    Problem Noted Resolved   Supervision of normal first pregnancy, antepartum 05/28/2017 by Natale MilchSchuman, Nelsie Domino R, MD No   Overview Addendum 07/23/2017  5:19 PM by Natale MilchSchuman, Jeda Pardue R, MD      Clinic [facility] Prenatal Labs  Dating  20wk US Blood type: A/Positive/-- (01/21 0000)   Genetic Screen  NIPS:   Normal XX Antibody:Negative (01/21 0000)  Anatomic US  Renal pyelectasis Rubella: Nonimmune (01/21 0000) Varicella:   GTT Early:        3hr:  passed    RPR: Nonreactive (01/21 0000)   Rhogam  not applicable HBsAg: Negative (01/21 0000)   TDaP vaccine   06/19/17                    HIV: Non-reactive (01/21 0000)   Flu Shot  Declines                              GBS:   Contraception  Nexplanon? Pap: NIL 2019  CBB   Given information   CS/VBAC  not applicable   Baby Food  Breast   Support Person                 Gestational age appropriate obstetric precautions including but not limited to vaginal bleeding, contractions, leaking of fluid and fetal movement were reviewed in detail with the patient.    Patient requested that I be present at her birth.   Return in about 1 week (around 07/30/2017) for ROB.   Adelene Idlerhristanna Jveon Pound MD [facility], [facility] 07/23/2017, 5:19 PM

## 2017-07-30 ENCOUNTER — Ambulatory Visit: Payer: Medicaid Other | Admitting: Family Medicine

## 2017-07-30 ENCOUNTER — Encounter: Payer: Self-pay | Admitting: Family Medicine

## 2017-07-30 VITALS — BP 112/60 | HR 91 | Temp 97.9°F | Resp 16 | Wt 122.0 lb

## 2017-07-30 DIAGNOSIS — J069 Acute upper respiratory infection, unspecified: Secondary | ICD-10-CM

## 2017-07-30 DIAGNOSIS — B9789 Other viral agents as the cause of diseases classified elsewhere: Secondary | ICD-10-CM

## 2017-07-30 MED ORDER — FLUTICASONE PROPIONATE 50 MCG/ACT NA SUSP: 2.0000 | Freq: Every day | NASAL | 6 refills | Status: DC

## 2017-07-30 NOTE — Patient Instructions (Signed)
Upper Respiratory Infection, Adult Most upper respiratory infections (URIs) are caused by a virus. A URI affects the nose, throat, and upper air passages. The most common type of URI is often called "the common cold." Follow these instructions at home:  Take medicines only as told by your doctor.  Gargle warm saltwater or take cough drops to comfort your throat as told by your doctor.  Use a warm mist humidifier or inhale steam from a shower to increase air moisture. This may make it easier to breathe.  Drink enough fluid to keep your pee (urine) clear or pale yellow.  Eat soups and other clear broths.  Have a healthy diet.  Rest as needed.  Go back to work when your fever is gone or your doctor says it is okay. ? You may need to stay home longer to avoid giving your URI to others. ? You can also wear a face mask and wash your hands often to prevent spread of the virus.  Use your inhaler more if you have asthma.  Do not use any tobacco products, including cigarettes, chewing tobacco, or electronic cigarettes. If you need help quitting, ask your doctor. Contact a doctor if:  You are getting worse, not better.  Your symptoms are not helped by medicine.  You have chills.  You are getting more short of breath.  You have brown or red mucus.  You have yellow or brown discharge from your nose.  You have pain in your face, especially when you bend forward.  You have a fever.  You have puffy (swollen) neck glands.  You have pain while swallowing.  You have white areas in the back of your throat. Get help right away if:  You have very bad or constant: ? Headache. ? Ear pain. ? Pain in your forehead, behind your eyes, and over your cheekbones (sinus pain). ? Chest pain.  You have long-lasting (chronic) lung disease and any of the following: ? Wheezing. ? Long-lasting cough. ? Coughing up blood. ? A change in your usual mucus.  You have a stiff neck.  You have  changes in your: ? Vision. ? Hearing. ? Thinking. ? Mood. This information is not intended to replace advice given to you by your health care provider. Make sure you discuss any questions you have with your health care provider. Document Released: 09/13/2007 Document Revised: 11/28/2015 Document Reviewed: 07/02/2013 Elsevier Interactive Patient Education  2018 Elsevier Inc.  

## 2017-07-30 NOTE — Progress Notes (Signed)
Patient: Gloria Morris Female    DOB: 07/17/1990   27 y.o.   MRN: [id number] Visit Date: 07/30/2017  Today's Provider: Shirlee LatchAngela Torianne Laflam, MD   I, Joslyn HyEmily Ratchford, CMA, am acting as scribe for Shirlee LatchAngela Gigi Onstad, MD.  Chief Complaint  Patient presents with  . Sore Throat   Subjective:    Sore Throat   This is a new problem. Episode onset: x 2 days. The problem has been gradually worsening. Neither side of throat is experiencing more pain than the other. Maximum temperature: highest documented temperature was 99.9 this morning. The pain is moderate. Associated symptoms include coughing, ear pain (and itches), headaches, a hoarse voice and trouble swallowing. Pertinent negatives include no abdominal pain, congestion, ear discharge, plugged ear sensation, shortness of breath, swollen glands or vomiting. She has had no exposure to strep. Treatments tried: Tylenol and Hall Cough Drops. The treatment provided mild relief.   Mother and sister recently with similar symptoms.  Patient also thinks that her allergies are contributing to her symptoms.  Has not been taking any allergy treatments.    No Known Allergies   Current Outpatient Medications:  .  docusate sodium (COLACE) 100 MG capsule, Take 1 capsule (100 mg total) by mouth 2 (two) times daily as needed., Disp: 30 capsule, Rfl: 2 .  ferrous sulfate (FERROUSUL) 325 (65 FE) MG tablet, Take 1 tablet (325 mg total) by mouth 2 (two) times daily., Disp: 60 tablet, Rfl: 1 .  fluocinolone (VANOS) 0.01 % cream, Apply topically 2 (two) times daily., Disp: 60 g, Rfl: 1 .  Fluocinolone Acetonide Scalp 0.01 % OIL, Use daily on scalp prn for psoriasis, Disp: 120 mL, Rfl: 3 .  ondansetron (ZOFRAN) 4 MG tablet, Take 1 tablet (4 mg total) by mouth every 8 (eight) hours as needed for nausea or vomiting., Disp: 20 tablet, Rfl: 0 .  Prenatal Vit-Fe Fumarate-FA (PRENATAL MULTIVITAMIN) TABS tablet, Take 1 tablet by mouth daily at 12 noon., Disp: ,  Rfl:   Review of Systems  HENT: Positive for ear pain (and itches), hoarse voice and trouble swallowing. Negative for congestion and ear discharge.   Respiratory: Positive for cough. Negative for shortness of breath.   Gastrointestinal: Negative for abdominal pain and vomiting.  Neurological: Positive for headaches.    Social History   Tobacco Use  . Smoking status: Former Games developermoker  . Smokeless tobacco: Never Used  Substance Use Topics  . Alcohol use: No   Objective:   BP 112/60 (BP Location: Left Arm, Patient Position: Sitting, Cuff Size: Normal)   Pulse 91   Temp 97.9 F (36.6 C) (Oral)   Resp 16   Wt 122 lb (55.3 kg)   SpO2 99%   BMI 20.30 kg/m  Vitals:   07/30/17 1127  BP: 112/60  Pulse: 91  Resp: 16  Temp: 97.9 F (36.6 C)  TempSrc: Oral  SpO2: 99%  Weight: 122 lb (55.3 kg)     Physical Exam  Constitutional: She is oriented to person, place, and time. She appears well-developed and well-nourished. She does not appear ill. No distress.  HENT:  Head: Normocephalic and atraumatic.  Right Ear: Tympanic membrane and ear canal normal.  Left Ear: Tympanic membrane and ear canal normal.  Mouth/Throat: Uvula is midline and mucous membranes are normal. No oral lesions. Posterior oropharyngeal erythema present. No oropharyngeal exudate or posterior oropharyngeal edema. No tonsillar exudate.  Eyes: Pupils are equal, round, and reactive to light.  Neck: Neck supple. No  thyromegaly present.  Cardiovascular: Normal rate, regular rhythm, normal heart sounds and intact distal pulses.  No murmur heard. Pulmonary/Chest: Effort normal and breath sounds normal. No respiratory distress. She has no wheezes. She has no rales.  Lymphadenopathy:    She has no cervical adenopathy.  Neurological: She is alert and oriented to person, place, and time.  Skin: Skin is warm and dry. Capillary refill takes less than 2 seconds. No rash noted.  Psychiatric: She has a normal mood and affect.  Her behavior is normal.       Assessment & Plan:   1. Viral URI with cough - symptoms and exam c/w viral URI - no evidence of strep pharyngitis, CAP, AOM, bacterial sinusitis, or other bacterial infection - discussed symptomatic management, natural course, and return precautions - could also represent allergic rhinitis, but fever makes this less likely - time will tell   Meds ordered this encounter  Medications  . fluticasone (FLONASE) 50 MCG/ACT nasal spray    Sig: Place 2 sprays into both nostrils daily.    Dispense:  16 g    Refill:  6     Return if symptoms worsen or fail to improve.   The entirety of the information documented in the History of Present Illness, Review of Systems and Physical Exam were personally obtained by me. Portions of this information were initially documented by Irving Burton Ratchford, CMA and reviewed by me for thoroughness and accuracy.    Erasmo Downer, MD, MPH [facility] 07/30/2017 2:09 PM

## 2017-07-31 ENCOUNTER — Encounter: Payer: Medicaid Other | Admitting: Obstetrics and Gynecology

## 2017-08-01 ENCOUNTER — Ambulatory Visit (INDEPENDENT_AMBULATORY_CARE_PROVIDER_SITE_OTHER): Payer: Medicaid Other | Admitting: Obstetrics and Gynecology

## 2017-08-01 ENCOUNTER — Encounter: Payer: Self-pay | Admitting: Obstetrics and Gynecology

## 2017-08-01 VITALS — BP 108/62 | Wt 123.0 lb

## 2017-08-01 DIAGNOSIS — Z3400 Encounter for supervision of normal first pregnancy, unspecified trimester: Secondary | ICD-10-CM

## 2017-08-01 DIAGNOSIS — D649 Anemia, unspecified: Secondary | ICD-10-CM

## 2017-08-01 DIAGNOSIS — Z3A36 36 weeks gestation of pregnancy: Secondary | ICD-10-CM

## 2017-08-01 DIAGNOSIS — O2613 Low weight gain in pregnancy, third trimester: Secondary | ICD-10-CM

## 2017-08-01 MED ORDER — FERROUS SULFATE 325 (65 FE) MG PO TABS: 325.0000 mg | ORAL_TABLET | Freq: Two times a day (BID) | ORAL | 11 refills | Status: DC

## 2017-08-01 NOTE — Progress Notes (Signed)
Routine Prenatal Care Visit  Subjective  Gloria Morris is a 27 y.o. G1P0 at 4837w2d being seen today for ongoing prenatal care.  She is currently monitored for the following issues for this low-risk pregnancy and has Generalized anxiety disorder; OCD (obsessive compulsive disorder); Asperger's disorder; Ingrown right big toenail; Contraception management; Rubella non-immune status, antepartum; Supervision of normal first pregnancy, antepartum; and Anemia on their problem list.  ----------------------------------------------------------------------------------- Patient reports that she has been having a lot of phlegm with coughing and vomiting. She was seen 4/22 for a URO by her primary care doctor who started her on flonase. Denies fevers. Contractions: Not present. Vag. Bleeding: None.  Movement: Present. Denies leaking of fluid.  ----------------------------------------------------------------------------------- The following portions of the patient's history were reviewed and updated as appropriate: allergies, current medications, past family history, past medical history, past social history, past surgical history and problem list. Problem list updated.   Objective  Blood pressure 108/62, weight 123 lb (55.8 kg). Pregravid weight 101 lb (45.8 kg) Total Weight Gain 22 lb (9.979 kg) Urinalysis: Urine Protein: Negative Urine Glucose: Negative  Fetal Status: Fetal Heart Rate (bpm): 150 Fundal Height: 36 cm Movement: Present     General:  Alert, oriented and cooperative. Patient is in no acute distress.  Skin: Skin is warm and dry. No rash noted.   Cardiovascular: Normal heart rate noted  Respiratory: Normal respiratory effort, no problems with respiration noted. Lungs clear to auscultation. No wheezing. No crackles.   Abdomen: Soft, gravid, appropriate for gestational age. Pain/Pressure: Absent     Pelvic:  Cervical exam performed Dilation: Closed Effacement (%): 0 Station: -3    Extremities: Normal range of motion.     ental Status: Normal mood and affect. Normal behavior. Normal judgment and thought content.     Assessment   27 y.o. G1P0 at 437w2d by  08/27/2017, by Other Basis presenting for routine prenatal visit  Plan   pregancy Problems (from 05/23/17 to present)    Problem Noted Resolved   Supervision of normal first pregnancy, antepartum 05/28/2017 by Natale MilchSchuman, Alante Weimann R, MD No   Overview Addendum 07/23/2017  5:19 PM by Natale MilchSchuman, Gilberto Streck R, MD      Clinic [facility] Prenatal Labs  Dating  20wk US Blood type: A/Positive/-- (01/21 0000)   Genetic Screen  NIPS:   Normal XX Antibody:Negative (01/21 0000)  Anatomic US  Renal pyelectasis Rubella: Nonimmune (01/21 0000) Varicella:   GTT Early:        3hr:  passed    RPR: Nonreactive (01/21 0000)   Rhogam  not applicable HBsAg: Negative (01/21 0000)   TDaP vaccine   06/19/17                    HIV: Non-reactive (01/21 0000)   Flu Shot  Declines                              GBS:   Contraception  Nexplanon? Pap: NIL 2019  CBB   Given information   CS/VBAC  not applicable   Baby Food  Breast   Support Person                 Gestational age appropriate obstetric precautions including but not limited to vaginal bleeding, contractions, leaking of fluid and fetal movement were reviewed in detail with the patient.    GBS, GC/CT collected today Given pamphlet on doula program, patient is planning  on having a doula present for her birth. Low weight gain- advised ensure shakes twice a day URI- okay for mucinex and robitussin as needed for cold symptoms Return in about 1 week (around 08/08/2017) for ROB.  Adelene Idler MD [facility], [facility] 08/01/2017, 11:38 AM

## 2017-08-01 NOTE — Addendum Note (Signed)
Addended by: Adelene IdlerSCHUMAN, Nicolet Griffy on: 08/01/2017 03:10 PM   Modules accepted: Orders

## 2017-08-01 NOTE — Progress Notes (Signed)
Pt c/o mucous in her throat and vomiting. GBS/aptima today.

## 2017-08-05 LAB — CULTURE, BETA STREP (GROUP B ONLY): Strep Gp B Culture: NEGATIVE

## 2017-08-05 LAB — GC/CHLAMYDIA PROBE AMP
Chlamydia trachomatis, NAA: NEGATIVE
NEISSERIA GONORRHOEAE BY PCR: NEGATIVE

## 2017-08-05 NOTE — Progress Notes (Signed)
Please call patient and let her know about these negative results.  Thank you, Dr. Jerene Pitch

## 2017-08-08 ENCOUNTER — Ambulatory Visit (INDEPENDENT_AMBULATORY_CARE_PROVIDER_SITE_OTHER): Payer: Medicaid Other | Admitting: Advanced Practice Midwife

## 2017-08-08 ENCOUNTER — Encounter: Payer: Self-pay | Admitting: Advanced Practice Midwife

## 2017-08-08 VITALS — BP 120/80 | Wt 125.0 lb

## 2017-08-08 DIAGNOSIS — Z3A37 37 weeks gestation of pregnancy: Secondary | ICD-10-CM

## 2017-08-08 NOTE — Patient Instructions (Signed)
Vaginal Delivery Vaginal delivery means that you will give birth by pushing your baby out of your birth canal (vagina). A team of health care providers will help you before, during, and after vaginal delivery. Birth experiences are unique for every woman and every pregnancy, and birth experiences vary depending on where you choose to give birth. What should I do to prepare for my baby's birth? Before your baby is born, it is important to talk with your health care provider about:  Your labor and delivery preferences. These may include: ? Medicines that you may be given. ? How you will manage your pain. This might include non-medical pain relief techniques or injectable pain relief such as epidural analgesia. ? How you and your baby will be monitored during labor and delivery. ? Who may be in the labor and delivery room with you. ? Your feelings about surgical delivery of your baby (cesarean delivery, or C-section) if this becomes necessary. ? Your feelings about receiving donated blood through an IV tube (blood transfusion) if this becomes necessary.  Whether you are able: ? To take pictures or videos of the birth. ? To eat during labor and delivery. ? To move around, walk, or change positions during labor and delivery.  What to expect after your baby is born, such as: ? Whether delayed umbilical cord clamping and cutting is offered. ? Who will care for your baby right after birth. ? Medicines or tests that may be recommended for your baby. ? Whether breastfeeding is supported in your hospital or birth center. ? How long you will be in the hospital or birth center.  How any medical conditions you have may affect your baby or your labor and delivery experience.  To prepare for your baby's birth, you should also:  Attend all of your health care visits before delivery (prenatal visits) as recommended by your health care provider. This is important.  Prepare your home for your baby's  arrival. Make sure that you have: ? Diapers. ? Baby clothing. ? Feeding equipment. ? Safe sleeping arrangements for you and your baby.  Install a car seat in your vehicle. Have your car seat checked by a certified car seat installer to make sure that it is installed safely.  Think about who will help you with your new baby at home for at least the first several weeks after delivery.  What can I expect when I arrive at the birth center or hospital? Once you are in labor and have been admitted into the hospital or birth center, your health care provider may:  Review your pregnancy history and any concerns you have.  Insert an IV tube into one of your veins. This is used to give you fluids and medicines.  Check your blood pressure, pulse, temperature, and heart rate (vital signs).  Check whether your bag of water (amniotic sac) has broken (ruptured).  Talk with you about your birth plan and discuss pain control options.  Monitoring Your health care provider may monitor your contractions (uterine monitoring) and your baby's heart rate (fetal monitoring). You may need to be monitored:  Often, but not continuously (intermittently).  All the time or for long periods at a time (continuously). Continuous monitoring may be needed if: ? You are taking certain medicines, such as medicine to relieve pain or make your contractions stronger. ? You have pregnancy or labor complications.  Monitoring may be done by:  Placing a special stethoscope or a handheld monitoring device on your abdomen to   check your baby's heartbeat, and feeling your abdomen for contractions. This method of monitoring does not continuously record your baby's heartbeat or your contractions.  Placing monitors on your abdomen (external monitors) to record your baby's heartbeat and the frequency and length of contractions. You may not have to wear external monitors all the time.  Placing monitors inside of your uterus  (internal monitors) to record your baby's heartbeat and the frequency, length, and strength of your contractions. ? Your health care provider may use internal monitors if he or she needs more information about the strength of your contractions or your baby's heart rate. ? Internal monitors are put in place by passing a thin, flexible wire through your vagina and into your uterus. Depending on the type of monitor, it may remain in your uterus or on your baby's head until birth. ? Your health care provider will discuss the benefits and risks of internal monitoring with you and will ask for your permission before inserting the monitors.  Telemetry. This is a type of continuous monitoring that can be done with external or internal monitors. Instead of having to stay in bed, you are able to move around during telemetry. Ask your health care provider if telemetry is an option for you.  Physical exam Your health care provider may perform a physical exam. This may include:  Checking whether your baby is positioned: ? With the head toward your vagina (head-down). This is most common. ? With the head toward the top of your uterus (head-up or breech). If your baby is in a breech position, your health care provider may try to turn your baby to a head-down position so you can deliver vaginally. If it does not seem that your baby can be born vaginally, your provider may recommend surgery to deliver your baby. In rare cases, you may be able to deliver vaginally if your baby is head-up (breech delivery). ? Lying sideways (transverse). Babies that are lying sideways cannot be delivered vaginally.  Checking your cervix to determine: ? Whether it is thinning out (effacing). ? Whether it is opening up (dilating). ? How low your baby has moved into your birth canal.  What are the three stages of labor and delivery?  Normal labor and delivery is divided into the following three stages: Stage 1  Stage 1 is the  longest stage of labor, and it can last for hours or days. Stage 1 includes: ? Early labor. This is when contractions may be irregular, or regular and mild. Generally, early labor contractions are more than 10 minutes apart. ? Active labor. This is when contractions get longer, more regular, more frequent, and more intense. ? The transition phase. This is when contractions happen very close together, are very intense, and may last longer than during any other part of labor.  Contractions generally feel mild, infrequent, and irregular at first. They get stronger, more frequent (about every 2-3 minutes), and more regular as you progress from early labor through active labor and transition.  Many women progress through stage 1 naturally, but you may need help to continue making progress. If this happens, your health care provider may talk with you about: ? Rupturing your amniotic sac if it has not ruptured yet. ? Giving you medicine to help make your contractions stronger and more frequent.  Stage 1 ends when your cervix is completely dilated to 4 inches (10 cm) and completely effaced. This happens at the end of the transition phase. Stage 2  Once   your cervix is completely effaced and dilated to 4 inches (10 cm), you may start to feel an urge to push. It is common for the body to naturally take a rest before feeling the urge to push, especially if you received an epidural or certain other pain medicines. This rest period may last for up to 1-2 hours, depending on your unique labor experience.  During stage 2, contractions are generally less painful, because pushing helps relieve contraction pain. Instead of contraction pain, you may feel stretching and burning pain, especially when the widest part of your baby's head passes through the vaginal opening (crowning).  Your health care provider will closely monitor your pushing progress and your baby's progress through the vagina during stage 2.  Your  health care provider may massage the area of skin between your vaginal opening and anus (perineum) or apply warm compresses to your perineum. This helps it stretch as the baby's head starts to crown, which can help prevent perineal tearing. ? In some cases, an incision may be made in your perineum (episiotomy) to allow the baby to pass through the vaginal opening. An episiotomy helps to make the opening of the vagina larger to allow more room for the baby to fit through.  It is very important to breathe and focus so your health care provider can control the delivery of your baby's head. Your health care provider may have you decrease the intensity of your pushing, to help prevent perineal tearing.  After delivery of your baby's head, the shoulders and the rest of the body generally deliver very quickly and without difficulty.  Once your baby is delivered, the umbilical cord may be cut right away, or this may be delayed for 1-2 minutes, depending on your baby's health. This may vary among health care providers, hospitals, and birth centers.  If you and your baby are healthy enough, your baby may be placed on your chest or abdomen to help maintain the baby's temperature and to help you bond with each other. Some mothers and babies start breastfeeding at this time. Your health care team will dry your baby and help keep your baby warm during this time.  Your baby may need immediate care if he or she: ? Showed signs of distress during labor. ? Has a medical condition. ? Was born too early (prematurely). ? Had a bowel movement before birth (meconium). ? Shows signs of difficulty transitioning from being inside the uterus to being outside of the uterus. If you are planning to breastfeed, your health care team will help you begin a feeding. Stage 3  The third stage of labor starts immediately after the birth of your baby and ends after you deliver the placenta. The placenta is an organ that develops  during pregnancy to provide oxygen and nutrients to your baby in the womb.  Delivering the placenta may require some pushing, and you may have mild contractions. Breastfeeding can stimulate contractions to help you deliver the placenta.  After the placenta is delivered, your uterus should tighten (contract) and become firm. This helps to stop bleeding in your uterus. To help your uterus contract and to control bleeding, your health care provider may: ? Give you medicine by injection, through an IV tube, by mouth, or through your rectum (rectally). ? Massage your abdomen or perform a vaginal exam to remove any blood clots that are left in your uterus. ? Empty your bladder by placing a thin, flexible tube (catheter) into your bladder. ? Encourage   you to breastfeed your baby. After labor is over, you and your baby will be monitored closely to ensure that you are both healthy until you are ready to go home. Your health care team will teach you how to care for yourself and your baby. This information is not intended to replace advice given to you by your health care provider. Make sure you discuss any questions you have with your health care provider. Document Released: 01/04/2008 Document Revised: 10/15/2015 Document Reviewed: 04/11/2015 Elsevier Interactive Patient Education  2018 [employer]  

## 2017-08-08 NOTE — Progress Notes (Signed)
Routine Prenatal Care Visit  Subjective  Gloria Morris is a 27 y.o. G1P0 at [redacted]w[redacted]d being seen today for ongoing prenatal care.  She is currently monitored for the following issues for this high-risk pregnancy and has Generalized anxiety disorder; OCD (obsessive compulsive disorder); Asperger's disorder; Ingrown right big toenail; Contraception management; Rubella non-immune status, antepartum; Supervision of normal first pregnancy, antepartum; and Anemia on their problem list.  ----------------------------------------------------------------------------------- Patient reports baby breech on 3D ultrasound she had done 4 days ago. She was told her "fluid was low" but didn't know the number.   Contractions: Not present. Vag. Bleeding: None.  Movement: Present. Denies leaking of fluid.  ----------------------------------------------------------------------------------- The following portions of the patient's history were reviewed and updated as appropriate: allergies, current medications, past family history, past medical history, past social history, past surgical history and problem list. Problem list updated.   Objective  Blood pressure 120/80, weight 125 lb (56.7 kg). Pregravid weight 101 lb (45.8 kg) Total Weight Gain 24 lb (10.9 kg) Urinalysis: Urine Protein: Negative Urine Glucose: Negative  Fetal Status: Fetal Heart Rate (bpm): 127 Fundal Height: 37 cm Movement: Present  Presentation: Complete Breech  General:  Alert, oriented and cooperative. Patient is in no acute distress.  Skin: Skin is warm and dry. No rash noted.   Cardiovascular: Normal heart rate noted  Respiratory: Normal respiratory effort, no problems with respiration noted  Abdomen: Soft, gravid, appropriate for gestational age. Pain/Pressure: Absent     Pelvic:  Cervical exam performed Dilation: Closed Effacement (%): 30    Extremities: Normal range of motion.     Mental Status: Normal mood and affect. Normal  behavior. Normal judgment and thought content.   Assessment   27 y.o. G1P0 at [redacted]w[redacted]d by  08/27/2017, by Other Basis presenting for routine prenatal visit  Plan   pregancy Problems (from 05/23/17 to present)    Problem Noted Resolved   Supervision of normal first pregnancy, antepartum 05/28/2017 by Natale Milch, MD No   Overview Addendum 07/23/2017  5:19 PM by Natale Milch, MD      Clinic [facility] Prenatal Labs  Dating  20wk [place] Blood type: A/Positive/-- (01/21 0000)   Genetic Screen  NIPS:   Normal XX Antibody:Negative (01/21 0000)  Anatomic US  Renal pyelectasis Rubella: Nonimmune (01/21 0000) Varicella:   GTT Early:        3hr:  passed    RPR: Nonreactive (01/21 0000)   Rhogam  not applicable HBsAg: Negative (01/21 0000)   TDaP vaccine   06/19/17                    HIV: Non-reactive (01/21 0000)   Flu Shot  Declines                              GBS:   Contraception  Nexplanon? Pap: NIL 2019  CBB   Given information   CS/VBAC  not applicable   Baby Food  Breast   Support Person                 Term labor symptoms and general obstetric precautions including but not limited to vaginal bleeding, contractions, leaking of fluid and fetal movement were reviewed in detail with the patient. Please refer to After Visit Summary for other counseling recommendations.  Increase hydration with h2o today to attempt increase in amniotic fluid for tomorrow's ECV  Return in about 1 week (around 08/15/2017)  for rob.  Tresea Mall, CNM 08/08/2017 8:44 AM

## 2017-08-09 ENCOUNTER — Other Ambulatory Visit: Payer: Self-pay

## 2017-08-09 ENCOUNTER — Inpatient Hospital Stay
Admission: AD | Admit: 2017-08-09 | Discharge: 2017-08-11 | DRG: 832 | Disposition: A | Discharge status: Home / Self Care | Payer: Medicaid Other | Source: Ambulatory Visit | Attending: Obstetrics and Gynecology | Admitting: Obstetrics and Gynecology

## 2017-08-09 ENCOUNTER — Encounter: Payer: Self-pay | Admitting: *Deleted

## 2017-08-09 DIAGNOSIS — Z3400 Encounter for supervision of normal first pregnancy, unspecified trimester: Secondary | ICD-10-CM

## 2017-08-09 DIAGNOSIS — O4100X0 Oligohydramnios, unspecified trimester, not applicable or unspecified: Secondary | ICD-10-CM

## 2017-08-09 DIAGNOSIS — O9952 Diseases of the respiratory system complicating childbirth: Secondary | ICD-10-CM | POA: Diagnosis present

## 2017-08-09 DIAGNOSIS — O99344 Other mental disorders complicating childbirth: Secondary | ICD-10-CM | POA: Diagnosis present

## 2017-08-09 DIAGNOSIS — O4103X0 Oligohydramnios, third trimester, not applicable or unspecified: Secondary | ICD-10-CM | POA: Diagnosis present

## 2017-08-09 DIAGNOSIS — J45909 Unspecified asthma, uncomplicated: Secondary | ICD-10-CM | POA: Diagnosis present

## 2017-08-09 DIAGNOSIS — O321XX0 Maternal care for breech presentation, not applicable or unspecified: Principal | ICD-10-CM | POA: Diagnosis present

## 2017-08-09 DIAGNOSIS — F845 Asperger's syndrome: Secondary | ICD-10-CM | POA: Diagnosis present

## 2017-08-09 DIAGNOSIS — Z3A37 37 weeks gestation of pregnancy: Secondary | ICD-10-CM | POA: Diagnosis not present

## 2017-08-09 DIAGNOSIS — Z87891 Personal history of nicotine dependence: Secondary | ICD-10-CM

## 2017-08-09 DIAGNOSIS — O329XX0 Maternal care for malpresentation of fetus, unspecified, not applicable or unspecified: Secondary | ICD-10-CM | POA: Diagnosis present

## 2017-08-09 LAB — TYPE AND SCREEN
ABO/RH(D): A POS
Antibody Screen: NEGATIVE

## 2017-08-09 LAB — CBC
HCT: 33.4 % — ABNORMAL LOW (ref 35.0–47.0)
HEMOGLOBIN: 11.7 g/dL — AB (ref 12.0–16.0)
MCH: 33.6 pg (ref 26.0–34.0)
MCHC: 35 g/dL (ref 32.0–36.0)
MCV: 95.9 fL (ref 80.0–100.0)
Platelets: 217 10*3/uL (ref 150–440)
RBC: 3.48 MIL/uL — AB (ref 3.80–5.20)
RDW: 14 % (ref 11.5–14.5)
WBC: 8.7 10*3/uL (ref 3.6–11.0)

## 2017-08-09 MED ORDER — TERBUTALINE SULFATE 1 MG/ML IJ SOLN
0.2500 mg | INTRAMUSCULAR | Status: DC
  Filled 2017-08-09: qty 1

## 2017-08-09 MED ORDER — ACETAMINOPHEN 500 MG PO TABS
1000.0000 mg | ORAL_TABLET | Freq: Four times a day (QID) | ORAL | Status: DC | PRN
  Administered 2017-08-09 – 2017-08-11 (×3): 1000 mg via ORAL
  Filled 2017-08-09 (×2): qty 2

## 2017-08-09 MED ORDER — LACTATED RINGERS IV SOLN
INTRAVENOUS | Status: DC
Start: 2017-08-09 — End: 2017-08-11
  Administered 2017-08-09 (×2): via INTRAVENOUS
  Administered 2017-08-09: 1000 mL via INTRAVENOUS
  Administered 2017-08-10 (×3): via INTRAVENOUS

## 2017-08-09 MED ORDER — ACETAMINOPHEN 500 MG PO TABS
ORAL_TABLET | ORAL | Status: AC
  Filled 2017-08-09: qty 2

## 2017-08-09 NOTE — Progress Notes (Signed)
L&D Progress Note    S: Tolerated regular diet. Comfortable.   O: BP 120/66 (BP Location: Left Arm)   Pulse (!) 103   Temp 98.1 F (36.7 C) (Oral)   Resp 16   Ht  (1.651 m)   Wt 56.7 kg (125 lb)   BMI 20.80 kg/m    FHR: baseline 130 with accelerations to 160s to 180, moderate variability Toco: occasional irregular mild contractions  A: IUP at 37wk3 days with breech presentation and oligohydraminos Cat 1 tracing  P: Transfer to [facility] unit Continue IV hydration Ultrasound for growth and AFI ordered for tomorrow AM.   Farrel Conners, CNM

## 2017-08-09 NOTE — H&P (Addendum)
Obstetric H&P   Chief Complaint: Breech  Prenatal Care Provider: [facility]  History of Present Illness: 27 y.o. G1P0 59w3dby 08/27/2017, by Other Basis presenting to L&D for attempted external cephalic version secondary to complete breach presentation.  No LOF, no VB, no ctx. Patient was previously noted to be breech, and was told low fluid at [facility] 3d ultrasound.    Growth at 30 weeks 3lbs 7oz c/w 54%ile and AFI of 15.42cm  Pregravid weight 101 lb (45.8 kg) Total Weight Gain 24 lb (10.9 kg)  pregancy Problems (from 05/23/17 to present)    Problem Noted Resolved   Supervision of normal first pregnancy, antepartum 05/28/2017 by SHomero Fellers MD No   Overview Addendum 07/23/2017  5:19 PM by SHomero Fellers [place] Dating  20wk [place] type: A/Positive/-- (01/21 0000)   Genetic Screen  NIPS:   Normal XX Antibody:Negative (01/21 0000)  Anatomic [place] Renal pyelectasis Rubella: Nonimmune (01/21 0000) Varicella:   GTT Early:        3hr:  passed    RPR: Nonreactive (01/21 0000)   Rhogam  not applicable HBsAg: Negative (01/21 0000)   TDaP vaccine   06/19/17                    HIV: Non-reactive (01/21 0000)   Flu Shot  Declines                              GBS:   Contraception  Nexplanon? Pap: NIL 2019  CBB   Given information   CS/VBAC  not applicable   Baby Food  Breast   Support Person                 Review of Systems: 10 point review of systems negative unless otherwise noted in HPI  Past Medical History: Past Medical History:  Diagnosis Date  . Asperger syndrome 2009  . Asthma   . History of ADHD   . History of anxiety   . History of chicken pox    around age 27 . OCD (obsessive compulsive disorder)   . Psoriasis of scalp     Past Surgical History: Past Surgical History:  Procedure Laterality Date  . NO PAST SURGERIES      Past Obstetric History: #: 1, Date: None, Sex: None, Weight: None, GA: None, Delivery:  None, Apgar1: None, Apgar5: None, Living: None, Birth Comments: None   Past Gynecologic History:  Family History: Family History  Problem Relation Age of Onset  . Drug abuse Brother   . High blood pressure Maternal Grandfather   . Psoriasis Mother   . Psoriasis Maternal Grandmother     Social History: Social History   Socioeconomic History  . Marital status: Single    Spouse name: Not on file  . Number of children: Not on file  . Years of education: Not on file  . Highest education level: Not on file  Occupational History  . Not on file  Social Needs  . Financial resource strain: Not on file  . Food insecurity:    Worry: Not on file    Inability: Not on file  . Transportation needs:    Medical: Not on file    Non-medical: Not on file  Tobacco Use  . Smoking status: Former SResearch scientist (life sciences) . Smokeless tobacco: Never Used  Substance and Sexual  Activity  . Alcohol use: No  . Drug use: No  . Sexual activity: Not Currently    Birth control/protection: None  Lifestyle  . Physical activity:    Days per week: Not on file    Minutes per session: Not on file  . Stress: Not on file  Relationships  . Social connections:    Talks on phone: Not on file    Gets together: Not on file    Attends religious service: Not on file    Active member of club or organization: Not on file    Attends meetings of clubs or organizations: Not on file    Relationship status: Not on file  . Intimate partner violence:    Fear of current or ex partner: Not on file    Emotionally abused: Not on file    Physically abused: Not on file    Forced sexual activity: Not on file  Other Topics Concern  . Not on file  Social History Narrative  . Not on file    Medications: Prior to Admission medications   Medication Sig Start Date End Date Taking? Authorizing Provider  docusate sodium (COLACE) 100 MG capsule Take 1 capsule (100 mg total) by mouth 2 (two) times daily as needed. 05/28/17   Schuman,  Stefanie Libel, MD  ferrous sulfate (FERROUSUL) 325 (65 FE) MG tablet Take 1 tablet (325 mg total) by mouth 2 (two) times daily. 08/01/17   Schuman, Stefanie Libel, MD  fluocinolone (VANOS) 0.01 % cream Apply topically 2 (two) times daily. 07/12/17   Mar Daring, PA-C  Fluocinolone Acetonide Scalp 0.01 % OIL Use daily on scalp prn for psoriasis 07/19/17   Mar Daring, PA-C  fluticasone [facility]) 50 MCG/ACT nasal spray Place 2 sprays into both nostrils daily. 07/30/17   Bacigalupo, Dionne Bucy, MD  ondansetron (ZOFRAN) 4 MG tablet Take 1 tablet (4 mg total) by mouth every 8 (eight) hours as needed for nausea or vomiting. 07/17/17   Virginia Crews, MD  Prenatal Vit-Fe Fumarate-FA (PRENATAL MULTIVITAMIN) TABS tablet Take 1 tablet by mouth daily at 12 noon.    [provider]    Allergies: No Known Allergies  Physical Exam: Vitals: Blood pressure 120/66, pulse (!) 103, temperature 98.1 F (36.7 C), temperature source Oral, resp. rate 16, height _0  (1.651 m), weight 125 lb (56.7 kg).  Urine Dip Protein: N/A  FHT: 120, moderate, +accels, decels Toco: none  General: NAD HEENT: normocephalic, anicteric Pulmonary: No increased work of breathing Cardiovascular: RRR, distal pulses 2+ Abdomen: Gravid, non-tender Leopolds: breech Genitourinary: deferred Extremities: no edema, erythema, or tenderness Neurologic: Grossly intact Psychiatric: mood appropriate, affect full  Labs: No results found for this or any previous visit (from the past 24 hour(s)).   Bedside Ultrasound: complete breech presentation, fetal spine maternal right, AFI low at 5.92cm with no 2x2 pocket seen.  Fundal placenta  Assessment: 27 y.o. G1P0 67w3dby 08/27/2017, by Other Basis presenting for scheduled external cephalic version  Plan: 1) ECV - low fluid, will IV hydrate, repeat ultrasound and growth, if no improvement in AFI to allow for version discussed Cesarean section timing in the setting of  oligohydramnios.  [id number]) Fetus - cat I tracing  3) PNL - Blood type A/Positive/-- (01/21 0000) / Anti-bodyscreen Negative (01/21 0000) / Rubella Nonimmune (01/21 0000) / / RPR Non Reactive (02/27 1204) / HBsAg Negative (01/21 0000) / HIV Non Reactive (02/27 1204)   4) Immunization History -  Immunization History  Administered Date(s) Administered  . DTaP 02/21/1990, 05/30/1990, 07/25/1990, 08/28/1991, 10/02/1994  . HiB (PRP-OMP) 02/21/1990, 05/30/1990, 07/25/1990, 05/01/1991  . IPV 02/21/1990, 05/30/1990, 08/28/1991, 10/02/1994  . MMR 05/01/1991, 10/02/1994  . Tdap 11/12/2012, 06/19/2017    5) Disposition -  Pending follow up ultrasound  Malachy Mood, MD, [place], [facility] 08/09/2017, 9:43 AM

## 2017-08-10 ENCOUNTER — Inpatient Hospital Stay: Payer: Medicaid Other

## 2017-08-10 DIAGNOSIS — J45909 Unspecified asthma, uncomplicated: Secondary | ICD-10-CM

## 2017-08-10 DIAGNOSIS — Z3A37 37 weeks gestation of pregnancy: Secondary | ICD-10-CM

## 2017-08-10 DIAGNOSIS — F845 Asperger's syndrome: Secondary | ICD-10-CM

## 2017-08-10 DIAGNOSIS — O329XX0 Maternal care for malpresentation of fetus, unspecified, not applicable or unspecified: Secondary | ICD-10-CM | POA: Diagnosis present

## 2017-08-10 DIAGNOSIS — O321XX0 Maternal care for breech presentation, not applicable or unspecified: Secondary | ICD-10-CM

## 2017-08-10 DIAGNOSIS — O4103X0 Oligohydramnios, third trimester, not applicable or unspecified: Secondary | ICD-10-CM

## 2017-08-10 MED ORDER — TERBUTALINE SULFATE 1 MG/ML IJ SOLN
0.2500 mg | INTRAMUSCULAR | Status: AC
  Administered 2017-08-10: 0.25 mg via SUBCUTANEOUS

## 2017-08-10 MED ORDER — TERBUTALINE SULFATE 1 MG/ML IJ SOLN
0.2500 mg | Freq: Once | INTRAMUSCULAR | Status: DC
  Filled 2017-08-10: qty 1

## 2017-08-10 MED ORDER — FENTANYL CITRATE (PF) 100 MCG/2ML IJ SOLN
50.0000 ug | INTRAMUSCULAR | Status: AC
  Administered 2017-08-10: 50 ug via INTRAVENOUS
  Filled 2017-08-10: qty 2

## 2017-08-10 MED ORDER — LACTATED RINGERS IV BOLUS
500.0000 mL | Freq: Once | INTRAVENOUS | Status: AC
  Administered 2017-08-10: 500 mL via INTRAVENOUS

## 2017-08-10 MED ORDER — MINERAL OIL LIGHT 100 % EX OIL
TOPICAL_OIL | Freq: Once | CUTANEOUS | Status: AC
  Administered 2017-08-10: via TOPICAL
  Filled 2017-08-10 (×2): qty 25

## 2017-08-10 NOTE — Progress Notes (Signed)
Patient ID: Gloria Morris, female   DOB: 1990/07/10, 27 y.o.   MRN: [id number] S: no acute events overnight. Notes +FM, no LOF, no vb. Occasional cramping  O: BP 114/73 (BP Location: Left Arm)   Pulse (!) 101   Temp 98.2 F (36.8 C) (Oral)   Resp 16   Ht  (1.651 m)   Wt 125 lb (56.7 kg)   SpO2 98%   BMI 20.80 kg/m   Gen: NAD Pulm: CTAB CV: RRR Abd: gravid, NT Ext: no e/c/t  A/P: 27 y.o. G1P0 at [redacted]w[redacted]d with breech presentation, oligohdramnios. This a late entry. See other notes from events of the day for more up-to-date details.  [name], MD, [facility], [facility] 08/10/2017 11:58 PM

## 2017-08-10 NOTE — Progress Notes (Signed)
Returned from [place], Transported to BP via WC to LDR 6.  Report given to Vergia Alberts, RN

## 2017-08-10 NOTE — Progress Notes (Signed)
To US via wheelchair

## 2017-08-11 ENCOUNTER — Other Ambulatory Visit: Payer: Self-pay | Admitting: Obstetrics and Gynecology

## 2017-08-11 DIAGNOSIS — O321XX0 Maternal care for breech presentation, not applicable or unspecified: Secondary | ICD-10-CM

## 2017-08-11 DIAGNOSIS — J45909 Unspecified asthma, uncomplicated: Secondary | ICD-10-CM

## 2017-08-11 DIAGNOSIS — Z3A37 37 weeks gestation of pregnancy: Secondary | ICD-10-CM

## 2017-08-11 DIAGNOSIS — F845 Asperger's syndrome: Secondary | ICD-10-CM

## 2017-08-11 DIAGNOSIS — O329XX0 Maternal care for malpresentation of fetus, unspecified, not applicable or unspecified: Secondary | ICD-10-CM

## 2017-08-11 DIAGNOSIS — O4103X0 Oligohydramnios, third trimester, not applicable or unspecified: Secondary | ICD-10-CM

## 2017-08-11 MED ORDER — ACETAMINOPHEN 500 MG PO TABS: 1000.0000 mg | ORAL_TABLET | Freq: Four times a day (QID) | ORAL | 0 refills | Status: DC | PRN

## 2017-08-11 NOTE — Procedures (Signed)
Informed consent was obtained.   Ultrasound showed fetus in complete breech with the spine anterior and the legs on either side of the fetus.  Discussed with patient the difficulty of attempting the ECV. She voiced that she would like to proceed.  She was given terbutaline 0.25 mg [place].   After 15 minutes passed she was given fentanyl 50 mcg IV.  The fetal breech was elevated from the pelvis. A clock-wise roll was attempted without succuss.  A counter-clockwise roll was attempted without success.    An ultrasound was utilized intermittently to assess the fetal heart rate, which was normal at each check.   After lack of any significant success or progress, the procedure was discontinued.   The patient and fetus tolerated the procedure well.  [name], MD, [facility], [facility] 08/11/2017 12:50 AM

## 2017-08-11 NOTE — Plan of Care (Signed)
  Problem: Education: Goal: Knowledge of General Education information will improve Outcome: Progressing   Problem: Health Behavior/Discharge Planning: Goal: Ability to manage health-related needs will improve Outcome: Progressing   Problem: Clinical Measurements: Goal: Will remain free from infection Outcome: Progressing Goal: Diagnostic test results will improve Outcome: Progressing   Problem: Coping: Goal: Level of anxiety will decrease Outcome: Progressing   Problem: Education: Goal: Knowledge of disease or condition will improve Outcome: Progressing Goal: Knowledge of the prescribed therapeutic regimen will improve Outcome: Progressing   Problem: Coping: Goal: Level of anxiety will decrease Outcome: Progressing Goal: Ability to identify and develop effective coping behavior will improve Outcome: Progressing Goal: Participation in decision-making will improve Outcome: Progressing   Problem: Physical Regulation: Goal: Complications related to the disease process, condition or treatment will be avoided or minimized Outcome: Progressing   Problem: Pain Management: Goal: Relief or control of pain will improve Outcome: Progressing

## 2017-08-11 NOTE — Progress Notes (Signed)
Patient was admitted yesterday (5/2) for external cephalic version.  On ultrasound she was noted to have oligohydramnios.  So, she was admitted overnight 5/2 for hydration.  On 5/3 she underwent an ultrasound for fetal growth showing a weight of 3,332 grams, 68%ile.  The AFI was 12.2 cm with a maximum vertical pocket of 5.1 cm.    She was moved to L&D for attempted external cephalic version.  The plan was to monitor the patient for about an hour prior to attempting ECV.  During this observation period she was noted to be having frequent contractions that she was not feeling. At about 11:56AM she had a 4 minute deceleration to the 70s (4 minutes from onset to return to baseline).  The variability was moderate the entire time and so the plan was to not attempt the ECV.   She had her cervix check and she was closed/80/high over a 4-hour period.    She continued to be monitored for an extended period of time given her deceleration.  She had 11 hours of cat 1 tracing and a negative CST with frequent contractions.    We discussed attempting an ECV given how reassuring her fetal tracing had been.   She agreed to an ECV.    See details in the procedure note for details. The ECV was unsuccessful. The fetus tolerated it well. The plan is to monitor her overnight. If she has a category 1 tracing overnight, she will be discharged in the morning. We have discussed scheduling her for a c-section at 39 weeks.    [name], MD, [facility], [facility] 08/11/2017 12:42 AM

## 2017-08-11 NOTE — Discharge Summary (Signed)
DC Summary Discharge Summary   Patient ID: Gloria Morris [id number] 27 y.o. 1990-12-23  Admit date: 08/09/2017  Discharge date: 08/11/2017  Principal Diagnoses:  1) intrauterine pregnancy at [redacted]w[redacted]d  2) malpresentation of fetus 3) oligohydramnios  Discharge Diagnoses: 1) intrauterine pregnancy at [redacted]w[redacted]d  2) malpresentation of fetus  Secondary Diagnoses:  none  Procedures performed during the hospitalization:  1) hydration 2) Ultrasound for fetal growth and Amniotic fluid index (08/10/17) 3) failed external cephalic version 4) fetal monitoring  HPI: 27 y.o. G1P0 female who was noted recently on ultrasound to have a fetus in breech presentation.  She also noted that she was told that her fluid was low after getting a vanity ultrasound.  She presented for external cephalic version. At admission she noted +FM, no LOF, no vaginal bleeding, and no contractions.    Past Medical History:  Diagnosis Date  . Asperger syndrome 2009  . Asthma   . History of ADHD   . History of anxiety   . History of chicken pox    around age 9  . OCD (obsessive compulsive disorder)   . Psoriasis of scalp     Past Surgical History:  Procedure Laterality Date  . NO PAST SURGERIES     Allergies: No Known Allergies  Social History   Tobacco Use  . Smoking status: Former Games developer  . Smokeless tobacco: Never Used  Substance Use Topics  . Alcohol use: No  . Drug use: No    Family History  Problem Relation Age of Onset  . Drug abuse Brother   . High blood pressure Maternal Grandfather   . Psoriasis Mother   . Psoriasis Maternal [facility] Course:  She was admitted for external cephalic version.  A bedside ultrasound showed an AFI of 5.6 cm, but no 2 cm vertical pocket.  Her ECV was delayed in favor of overnight hydration. An ultrasound was performed on HD#2 for growth (68th %ile) and AFI (12.2 cm).  She was transferred to Labor and delivery for pre-procedure monitoring.   During this time she had a 4-minute deceleration to the 70s, which returned to baseline. She was monitored for an extended period of time after this (11 hours) with a category 1 tracing and negative CST (appeared to be contracting frequently at times).  An ECV was attempted and was unsuccessful (fetal spine anterior and fetal legs on either side of the fetal trunk).  She was monitored over night.  She had one episode where there was a question of a fetal heart rate deceleration. However, the lines were not connected on the tracing and the fetal heart rate was quickly normal with moderate variability and +accelerations. She continued to be monitored for another 6 hours with a category 1 tracing and a negative CST.  At discharge, she was reporting only rare cramping, +FM, no LOF, and no vaginal bleeding.  She was discharged with the follow up plan of having a repeat AFI and NST in clinic in 2-3 days.   Discharge Exam: BP 124/73 (BP Location: Left Arm)   Pulse 79   Temp 98.6 F (37 C) (Oral)   Resp 14   Ht  (1.651 m)   Wt 125 lb (56.7 kg)   SpO2 98%   BMI 20.80 kg/m  General  no apparent distress   CV  RRR   Pulmonary  clear to ausculatation bllaterally   Abdomen  Gravid/nt   Extremities  no edema, symmetric   Condition  at Discharge: Stable  Complications affecting treatment: None  Discharge Medications:  Allergies as of 08/11/2017   No Known Allergies     Medication List    TAKE these medications   acetaminophen 500 MG tablet Commonly known as:  TYLENOL Take 2 tablets (1,000 mg total) by mouth every 6 (six) hours as needed for fever or headache.   docusate sodium 100 MG capsule Commonly known as:  COLACE Take 1 capsule (100 mg total) by mouth 2 (two) times daily as needed.   ferrous sulfate 325 (65 FE) MG tablet Commonly known as:  FERROUSUL Take 1 tablet (325 mg total) by mouth 2 (two) times daily.   fluocinolone 0.01 % cream Commonly known as:  VANOS Apply topically 2  (two) times daily.   Fluocinolone Acetonide Scalp 0.01 % Oil Use daily on scalp prn for psoriasis   fluticasone 50 MCG/ACT nasal spray Commonly known as:  FLONASE Place 2 sprays into both nostrils daily.   ondansetron 4 MG tablet Commonly known as:  ZOFRAN Take 1 tablet (4 mg total) by mouth every 8 (eight) hours as needed for nausea or vomiting.   prenatal multivitamin Tabs tablet Take 1 tablet by mouth daily at 12 noon.      Follow-up arrangements:  Follow-up Information    [facility] Follow up on 08/13/2017.   Specialty:  Obstetrics and Gynecology Why:  Ultrasound for AFI with routine prenatal visit and Non-stress test (NST) Contact information: [address] [place] [id number] [phone]         Discharge Disposition: Home to self care  Signed: [name], MD 08/11/2017 8:28 AM

## 2017-08-11 NOTE — Progress Notes (Signed)
Notified of potential FHR decel. Tracing reviewed.  It is inconclusive given the tracing is not connected. Current tracing reassuring. Continue to monitor.

## 2017-08-11 NOTE — Discharge Instructions (Signed)
Call the office to schedule NST and Ultrasound for AFI for Monday or Tuesday.  If you have any questions or concerns please call the on call provider for [facility].  You may also call the nurse's desk at the [facility] at [facility] for questions at [phone].  If you have urgent concerns please go to the nearest emergency department for evaluation.

## 2017-08-13 ENCOUNTER — Telehealth: Payer: Self-pay

## 2017-08-13 ENCOUNTER — Telehealth: Payer: Self-pay | Admitting: Obstetrics and Gynecology

## 2017-08-13 NOTE — Telephone Encounter (Signed)
Pt called after hour nurse 08/11/17 8:49pm c/o 37wk5d, feet swollen.  After hour nurse adv eval within the next 2wks, common in preg elevate feet several times a day for , do not lie flat on back but tilt to the left, drink adequate liquids, avoid sock c an elastic band at the top, wear comfortable shoes, may need maternity support hose. To call is becomes worse, swelling becomes red or painful to the touch, calf pain occurs and becomes constant.  [phone].  Left msg I was calling to f/u.

## 2017-08-13 NOTE — Telephone Encounter (Signed)
SDJ ordered u/s for pt to have - please ask him to put orders in. Pt's appt is Wednesday. Thank you.

## 2017-08-13 NOTE — Telephone Encounter (Signed)
Please disregard

## 2017-08-14 ENCOUNTER — Other Ambulatory Visit: Payer: Self-pay | Admitting: Obstetrics and Gynecology

## 2017-08-14 DIAGNOSIS — O288 Other abnormal findings on antenatal screening of mother: Secondary | ICD-10-CM

## 2017-08-14 DIAGNOSIS — O329XX0 Maternal care for malpresentation of fetus, unspecified, not applicable or unspecified: Secondary | ICD-10-CM

## 2017-08-14 DIAGNOSIS — Z3689 Encounter for other specified antenatal screening: Secondary | ICD-10-CM

## 2017-08-14 NOTE — Telephone Encounter (Signed)
Pt called yesterday morning and scheduled appt 08/15/17.

## 2017-08-15 ENCOUNTER — Other Ambulatory Visit: Payer: Self-pay

## 2017-08-15 ENCOUNTER — Inpatient Hospital Stay: Payer: Medicaid Other | Admitting: Anesthesiology

## 2017-08-15 ENCOUNTER — Ambulatory Visit (INDEPENDENT_AMBULATORY_CARE_PROVIDER_SITE_OTHER): Payer: Medicaid Other | Admitting: Maternal Newborn

## 2017-08-15 ENCOUNTER — Encounter
Admission: RE | Disposition: A | Discharge status: Home / Self Care | Payer: Self-pay | Source: Ambulatory Visit | Attending: Obstetrics & Gynecology

## 2017-08-15 ENCOUNTER — Ambulatory Visit (INDEPENDENT_AMBULATORY_CARE_PROVIDER_SITE_OTHER): Payer: Medicaid Other

## 2017-08-15 ENCOUNTER — Encounter: Payer: Self-pay | Admitting: Maternal Newborn

## 2017-08-15 ENCOUNTER — Inpatient Hospital Stay
Admission: RE | Admit: 2017-08-15 | Discharge: 2017-08-18 | DRG: 787 | Disposition: A | Discharge status: Home / Self Care | Payer: Medicaid Other | Source: Ambulatory Visit | Attending: Obstetrics & Gynecology | Admitting: Obstetrics & Gynecology

## 2017-08-15 VITALS — BP 104/68 | Wt 129.0 lb

## 2017-08-15 DIAGNOSIS — O4100X0 Oligohydramnios, unspecified trimester, not applicable or unspecified: Secondary | ICD-10-CM | POA: Diagnosis present

## 2017-08-15 DIAGNOSIS — Z87891 Personal history of nicotine dependence: Secondary | ICD-10-CM | POA: Diagnosis not present

## 2017-08-15 DIAGNOSIS — O288 Other abnormal findings on antenatal screening of mother: Secondary | ICD-10-CM

## 2017-08-15 DIAGNOSIS — Z3A38 38 weeks gestation of pregnancy: Secondary | ICD-10-CM

## 2017-08-15 DIAGNOSIS — D62 Acute posthemorrhagic anemia: Secondary | ICD-10-CM | POA: Diagnosis not present

## 2017-08-15 DIAGNOSIS — Z3689 Encounter for other specified antenatal screening: Secondary | ICD-10-CM | POA: Diagnosis not present

## 2017-08-15 DIAGNOSIS — Z3400 Encounter for supervision of normal first pregnancy, unspecified trimester: Secondary | ICD-10-CM

## 2017-08-15 DIAGNOSIS — O4103X0 Oligohydramnios, third trimester, not applicable or unspecified: Principal | ICD-10-CM | POA: Diagnosis present

## 2017-08-15 DIAGNOSIS — O321XX0 Maternal care for breech presentation, not applicable or unspecified: Secondary | ICD-10-CM | POA: Diagnosis present

## 2017-08-15 DIAGNOSIS — O9081 Anemia of the puerperium: Secondary | ICD-10-CM | POA: Diagnosis not present

## 2017-08-15 DIAGNOSIS — O329XX0 Maternal care for malpresentation of fetus, unspecified, not applicable or unspecified: Secondary | ICD-10-CM

## 2017-08-15 LAB — TYPE AND SCREEN
ABO/RH(D): A POS
ANTIBODY SCREEN: NEGATIVE

## 2017-08-15 LAB — FETAL NONSTRESS TEST

## 2017-08-15 LAB — CBC
HCT: 33.1 % — ABNORMAL LOW (ref 35.0–47.0)
Hemoglobin: 11.5 g/dL — ABNORMAL LOW (ref 12.0–16.0)
MCH: 33.3 pg (ref 26.0–34.0)
MCHC: 34.8 g/dL (ref 32.0–36.0)
MCV: 95.8 fL (ref 80.0–100.0)
PLATELETS: 190 10*3/uL (ref 150–440)
RBC: 3.46 MIL/uL — ABNORMAL LOW (ref 3.80–5.20)
RDW: 14 % (ref 11.5–14.5)
WBC: 8.4 10*3/uL (ref 3.6–11.0)

## 2017-08-15 SURGERY — Surgical Case
Anesthesia: Spinal | Site: Abdomen | Wound class: Clean Contaminated

## 2017-08-15 MED ORDER — BUPIVACAINE HCL (PF) 0.5 % IJ SOLN
INTRAMUSCULAR | Status: DC | PRN
  Administered 2017-08-15: 10 mL

## 2017-08-15 MED ORDER — BUPIVACAINE 0.25 % ON-Q PUMP DUAL CATH 400 ML
400.0000 mL | INJECTION | Status: DC
  Filled 2017-08-15: qty 400

## 2017-08-15 MED ORDER — ONDANSETRON HCL 4 MG/2ML IJ SOLN
INTRAMUSCULAR | Status: DC | PRN
  Administered 2017-08-15: 4 mg via INTRAVENOUS

## 2017-08-15 MED ORDER — ONDANSETRON HCL 4 MG/2ML IJ SOLN
INTRAMUSCULAR | Status: AC
  Filled 2017-08-15: qty 2

## 2017-08-15 MED ORDER — MORPHINE SULFATE (PF) 0.5 MG/ML IJ SOLN
INTRAMUSCULAR | Status: DC | PRN
  Administered 2017-08-15: .1 mg via EPIDURAL

## 2017-08-15 MED ORDER — CEFOXITIN SODIUM-DEXTROSE 2-2.2 GM-%(50ML) IV SOLR
INTRAVENOUS | Status: AC
  Filled 2017-08-15: qty 50

## 2017-08-15 MED ORDER — BUPIVACAINE HCL 0.5 % IJ SOLN
10.0000 mL | Freq: Once | INTRAMUSCULAR | Status: DC
  Filled 2017-08-15: qty 10

## 2017-08-15 MED ORDER — LACTATED RINGERS IV SOLN
INTRAVENOUS | Status: DC
  Administered 2017-08-15: 13:00:00 via INTRAVENOUS

## 2017-08-15 MED ORDER — OXYTOCIN BOLUS FROM INFUSION: 500.0000 mL | Freq: Once | INTRAVENOUS | Status: DC

## 2017-08-15 MED ORDER — MORPHINE SULFATE (PF) 0.5 MG/ML IJ SOLN
INTRAMUSCULAR | Status: AC
  Filled 2017-08-15: qty 10

## 2017-08-15 MED ORDER — OXYTOCIN 40 UNITS IN LACTATED RINGERS INFUSION - SIMPLE MED
2.5000 [IU]/h | INTRAVENOUS | Status: DC
  Administered 2017-08-15: 1000 mL via INTRAVENOUS
  Filled 2017-08-15: qty 1000

## 2017-08-15 MED ORDER — BUPIVACAINE IN DEXTROSE 0.75-8.25 % IT SOLN
INTRATHECAL | Status: AC
  Filled 2017-08-15: qty 2

## 2017-08-15 MED ORDER — LACTATED RINGERS IV SOLN
500.0000 mL | INTRAVENOUS | Status: DC | PRN
  Administered 2017-08-15: 1000 mL via INTRAVENOUS

## 2017-08-15 MED ORDER — SUCCINYLCHOLINE CHLORIDE 20 MG/ML IJ SOLN
INTRAMUSCULAR | Status: AC
  Filled 2017-08-15: qty 1

## 2017-08-15 MED ORDER — SOD CITRATE-CITRIC ACID 500-334 MG/5ML PO SOLN
ORAL | Status: AC
  Administered 2017-08-15: 30 mL via ORAL
  Filled 2017-08-15: qty 15

## 2017-08-15 MED ORDER — LACTATED RINGERS IV SOLN: INTRAVENOUS | Status: DC

## 2017-08-15 MED ORDER — CEFOXITIN SODIUM-DEXTROSE 2-2.2 GM-%(50ML) IV SOLR
2.0000 g | INTRAVENOUS | Status: AC
  Administered 2017-08-15: 2 g via INTRAVENOUS

## 2017-08-15 MED ORDER — SOD CITRATE-CITRIC ACID 500-334 MG/5ML PO SOLN
30.0000 mL | ORAL | Status: AC
  Administered 2017-08-15: 30 mL via ORAL

## 2017-08-15 MED ORDER — SODIUM CHLORIDE 0.9 % IV SOLN
INTRAVENOUS | Status: DC | PRN
  Administered 2017-08-15: 15 ug/min via INTRAVENOUS

## 2017-08-15 SURGICAL SUPPLY — 24 items
BARRIER ADHS 3X4 INTERCEED (GAUZE/BANDAGES/DRESSINGS) ×3 IMPLANT
CANISTER SUCT 3000ML PPV (MISCELLANEOUS) ×3 IMPLANT
CATH KIT ON-Q SILVERSOAK 5IN (CATHETERS) ×6 IMPLANT
CHLORAPREP W/TINT 26ML (MISCELLANEOUS) ×6 IMPLANT
DERMABOND ADVANCED (GAUZE/BANDAGES/DRESSINGS) ×4
DERMABOND ADVANCED .7 DNX12 (GAUZE/BANDAGES/DRESSINGS) ×2 IMPLANT
DRSG OPSITE POSTOP 4X10 (GAUZE/BANDAGES/DRESSINGS) IMPLANT
ELECT CAUTERY BLADE 6.4 (BLADE) ×3 IMPLANT
ELECT REM PT RETURN 9FT ADLT (ELECTROSURGICAL) ×3
ELECTRODE REM PT RTRN 9FT ADLT (ELECTROSURGICAL) ×1 IMPLANT
GLOVE SKINSENSE NS SZ8.0 LF (GLOVE) ×16
GLOVE SKINSENSE STRL SZ8.0 LF (GLOVE) ×8 IMPLANT
GOWN STRL REUS W/ TWL LRG LVL3 (GOWN DISPOSABLE) ×2 IMPLANT
GOWN STRL REUS W/ TWL XL LVL3 (GOWN DISPOSABLE) ×2 IMPLANT
GOWN STRL REUS W/TWL LRG LVL3 (GOWN DISPOSABLE) ×4
GOWN STRL REUS W/TWL XL LVL3 (GOWN DISPOSABLE) ×4
NS IRRIG 1000ML POUR BTL (IV SOLUTION) ×3 IMPLANT
PACK C SECTION AR (MISCELLANEOUS) ×3 IMPLANT
PAD OB MATERNITY 4.3X12.25 (PERSONAL CARE ITEMS) ×6 IMPLANT
PAD PREP 24X41 OB/GYN DISP (PERSONAL CARE ITEMS) ×3 IMPLANT
SUT MAXON ABS #0 GS21 30IN (SUTURE) ×6 IMPLANT
SUT VIC AB 1 CT1 36 (SUTURE) ×6 IMPLANT
SUT VIC AB 2-0 CT1 36 (SUTURE) ×3 IMPLANT
SUT VIC AB 4-0 FS2 27 (SUTURE) ×3 IMPLANT

## 2017-08-15 NOTE — Discharge Summary (Signed)
OB Discharge Summary     Patient Name: Gloria Morris DOB: 04-Jun-1990 MRN: [id number]  Date of admission: 08/15/2017 Delivering MD: Letitia Libra, MD  Date of Delivery: 08/15/2017  Date of discharge: 08/18/2017  Admitting diagnosis: BREECH Intrauterine pregnancy: [redacted]w[redacted]d     Secondary diagnosis: Oligohydramnios, Breech     Discharge diagnosis: same, Reasons for cesarean section  Malpresentation breech                         Hospital course:  Sceduled C/S   27 y.o. yo G1P0 at [redacted]w[redacted]d was admitted to the hospital 08/15/2017 for scheduled cesarean section with the following indication:Malpresentation.  Membrane Rupture Time/Date: 8:23 PM ,08/15/2017   Patient delivered a Viable infant on 08/15/2017.  Details of the operation can be found in separate operative note.  Pateint had an uncomplicated postpartum course.  She is ambulating, tolerating a regular diet, passing flatus, and urinating well. Patient is discharged home in stable condition on  08/18/17.                                                                        Post partum procedures: none  Complications: None  Physical exam on 08/18/2017: Vitals:   08/17/17 1623 08/17/17 2000 08/17/17 2340 08/18/17 0805  BP: 135/81 125/79 130/84 123/71  Pulse: 66 70 (!) 59 76  Resp: Temp: 98.4 F (36.9 C) 98.1 F (36.7 C) 97.8 F (36.6 C) 98 F (36.7 C)  TempSrc: Oral Oral Oral Oral  SpO2: 99% 98% 95% 100%  Weight:      Height:       General: alert, cooperative and no distress Lochia: appropriate Uterine Fundus: firm Incision: Dressing is clean, dry, and intact DVT Evaluation: No evidence of DVT seen on physical exam.  Labs: Lab Results  Component Value Date   WBC 9.6 08/16/2017   HGB 10.9 (L) 08/16/2017   HCT 31.7 (L) 08/16/2017   MCV 95.7 08/16/2017   PLT 170 08/16/2017   No flowsheet data found.  Discharge instruction: per After Visit Summary.  Medications:  Allergies as of 08/18/2017   No Known  Allergies     Medication List    STOP taking these medications   acetaminophen 500 MG tablet Commonly known as:  TYLENOL     TAKE these medications   ferrous sulfate 325 (65 FE) MG tablet Commonly known as:  FERROUSUL Take 1 tablet (325 mg total) by mouth 2 (two) times daily.   fluocinolone 0.01 % cream Commonly known as:  VANOS Apply topically 2 (two) times daily.   Fluocinolone Acetonide Scalp 0.01 % Oil Use daily on scalp prn for psoriasis   fluticasone 50 MCG/ACT nasal spray Commonly known as:  FLONASE Place 2 sprays into both nostrils daily.   oxyCODONE-acetaminophen 5-325 MG tablet Commonly known as:  PERCOCET/ROXICET Take 1-2 tablets by mouth every 6 (six) hours as needed.   prenatal multivitamin Tabs tablet Take 1 tablet by mouth daily at 12 noon.            Discharge Care Instructions  (From admission, onward)        Start     Ordered   08/18/17  0000  Discharge wound care:    Comments:  You may apply a light dressing for minor discharge from the incision or to keep waistbands of clothing from rubbing.  You may also have been discharge with a clear dressing in which case this will be removed at your postoperative clinic visit.  You may shower, use soap on your incision.  Avoid baths or soaking the incision in the first 6 weeks following your surgery.Marland Kitchen   08/18/17 1031      Diet: routine diet  Activity: Advance as tolerated. Pelvic rest for 6 weeks.   Outpatient follow up: Follow-up Information    Nadara Mustard, MD. Schedule an appointment as soon as possible for a visit in 1 week(s).   Specialty:  Obstetrics and Gynecology Why:  Post Op Contact information: [address] [place] [address] [phone]             Postpartum contraception: Progesterone only pills or Depo Peovera Rhogam Given postpartum: no Rubella vaccine given postpartum: yes Varicella vaccine given postpartum: no TDaP given antepartum or postpartum:  Yes  Newborn Data: Live born female  Birth Weight: 8 lb 5.3 oz (3780 g) APGAR: 8, 9  Newborn Delivery   Birth date/time:  08/15/2017 20:23:00 Delivery type:  C-Section, Low Transverse Trial of labor:  No C-section categorization:  Primary      Baby Feeding: Breast and formula  Disposition: home with mother  SIGNED:  Oswaldo Conroy, CNM 08/18/2017 11:19 AM

## 2017-08-15 NOTE — Progress Notes (Signed)
ROB AFI/NST 

## 2017-08-15 NOTE — Op Note (Signed)
Cesarean Section Procedure Note Indications: malpresentation: Breech, Oligohydramnios and term intrauterine pregnancy  Pre-operative Diagnosis: Intrauterine pregnancy [redacted]w[redacted]d ;  malpresentation: Breech, Oligo and term intrauterine pregnancy Post-operative Diagnosis: same, delivered. Procedure: Low Transverse Cesarean Section Surgeon: Annamarie Major, MD, FACOG Assistant(s): Maebelle Munroe, CNM Anesthesia: Spinal anesthesia Estimated Blood Loss:200  Complications: None; patient tolerated the procedure well. Disposition: PACU - hemodynamically stable. Condition: stable  Findings: A female infant in the breech (complete) presentation. Amniotic fluid - Clear  Birth weight 8-5 lbs  Apgars of 8 and 9.  Intact placenta with a three-vessel cord. Grossly normal uterus, tubes and ovaries bilaterally. No intraabdominal adhesions were noted.  Procedure Details   The patient was taken to Operating Room, identified as the correct patient and the procedure verified as C-Section Delivery. A Time Out was held and the above information confirmed. After induction of anesthesia, the patient was draped and prepped in the usual sterile manner. A Pfannenstiel incision was made and carried down through the subcutaneous tissue to the fascia. Fascial incision was made and extended transversely with the Mayo scissors. The fascia was separated from the underlying rectus tissue superiorly and inferiorly. The peritoneum was identified and entered bluntly. Peritoneal incision was extended longitudinally. The utero-vesical peritoneal reflection was incised transversely and a bladder flap was created digitally.  A low transverse hysterotomy was made. The fetus was delivered atraumatically. The umbilical cord was clamped x2 and cut and the infant was handed to the awaiting pediatricians. The placenta was removed intact and appeared normal with a 3-vessel cord.  The uterus was exteriorized and cleared of all clot and debris. The  hysterotomy was closed with running sutures of 0 Vicryl suture. A second imbricating layer was placed with the same suture. Excellent hemostasis was observed. The uterus was returned to the abdomen. The pelvis was irrigated and again, excellent hemostasis was noted. Interceed placed over incision. The On Q Pain pump System was then placed.  Trocars were placed through the abdominal wall into the subfascial space and these were used to thread the silver soaker cathaters into place.The rectus fascia was then reapproximated with running sutures of Maxon, with careful placement not to incorporate the cathaters. Subcutaneous tissues are then irrigated with saline and hemostasis assured.  Skin is then closed with 4-0 vicryl suture in a subcuticular fashion followed by skin adhesive. The cathaters are flushed each with 5 mL of Bupivicaine and stabilized into place with dressing. Instrument, sponge, and needle counts were correct prior to the abdominal closure and at the conclusion of the case.  The patient tolerated the procedure well and was transferred to the recovery room in stable condition.   Annamarie Major, MD, [facility], [facility] 08/15/2017  8:54 PM

## 2017-08-15 NOTE — Progress Notes (Signed)
CH spoke with patient about HCPOA and provided education. Patient stated she would look over and call when ready. Patient is excited about her first daughter being born and stated she was naming her Gloria Morris. A lovely name for a soon to be beautiful daughter. CH will follow-up and has informed [facility] as well.

## 2017-08-15 NOTE — Anesthesia Post-op Follow-up Note (Signed)
Anesthesia QCDR form completed.        

## 2017-08-15 NOTE — Transfer of Care (Signed)
Immediate Anesthesia Transfer of Care Note  Patient: Gloria Morris  Procedure(s) Performed: CESAREAN SECTION (N/A Abdomen)  Patient Location: PACU and Mother/Baby  Anesthesia Type:Spinal  Level of Consciousness: awake, alert  and oriented  Airway & Oxygen Therapy: Patient Spontanous Breathing  Post-op Assessment: Report given to RN and Post -op Vital signs reviewed and stable  Post vital signs: Reviewed and stable  Last Vitals:  Vitals Value Taken Time  BP 96/43 08/15/2017  9:01 PM  Temp    Pulse 86 08/15/2017  9:01 PM  Resp 20 08/15/2017  9:01 PM  SpO2 98 % 08/15/2017  9:01 PM    Last Pain:  Vitals:   08/15/17 1914  TempSrc: Oral  PainSc: 0-No pain         Complications: No apparent anesthesia complications

## 2017-08-15 NOTE — Anesthesia Preprocedure Evaluation (Addendum)
Anesthesia Evaluation  Patient identified by MRN, date of birth, ID band Patient awake    Reviewed: Allergy & Precautions, NPO status , Patient's Chart, lab work & pertinent test results  Airway Mallampati: II  TM Distance: >3 FB     Dental  (+) Teeth Intact   Pulmonary asthma , former smoker,    Pulmonary exam normal        Cardiovascular negative cardio ROS Normal cardiovascular exam     Neuro/Psych PSYCHIATRIC DISORDERS Anxiety    GI/Hepatic negative GI ROS, Neg liver ROS,   Endo/Other  negative endocrine ROS  Renal/GU negative Renal ROS  negative genitourinary   Musculoskeletal   Abdominal Normal abdominal exam  (+)   Peds negative pediatric ROS (+)  Hematology  (+) anemia ,   Anesthesia Other Findings Past Medical History: 2009: Asperger syndrome No date: Asthma No date: History of ADHD No date: History of anxiety No date: History of chicken pox     Comment:  around age 27 No date: OCD (obsessive compulsive disorder) No date: Psoriasis of scalp  Reproductive/Obstetrics (+) Pregnancy                            Anesthesia Physical Anesthesia Plan  ASA: II and emergent  Anesthesia Plan: Spinal   Post-op Pain Management:    Induction:   PONV Risk Score and Plan:   Airway Management Planned: Nasal Cannula  Additional Equipment:   Intra-op Plan:   Post-operative Plan:   Informed Consent: I have reviewed the patients History and Physical, chart, labs and discussed the procedure including the risks, benefits and alternatives for the proposed anesthesia with the patient or authorized representative who has indicated his/her understanding and acceptance.   Dental advisory given  Plan Discussed with: CRNA and Surgeon  Anesthesia Plan Comments:         Anesthesia Quick Evaluation

## 2017-08-15 NOTE — Progress Notes (Signed)
Routine Prenatal Care Visit  Subjective  Gloria Morris is a 27 y.o. G1P0 at [redacted]w[redacted]d being seen today for ongoing prenatal care.  She is currently monitored for the following issues for this high-risk pregnancy and has Generalized anxiety disorder; OCD (obsessive compulsive disorder); Asperger's disorder; Ingrown right big toenail; Contraception management; Rubella non-immune status, antepartum; Supervision of normal first pregnancy, antepartum; Anemia; Labor and delivery indication for care or intervention; and Malpresentation before onset of labor on their problem list.  ----------------------------------------------------------------------------------- Patient reports no complaints.   Contractions: Not present. Vag. Bleeding: None.  Movement: Present. Denies leaking of fluid.  ----------------------------------------------------------------------------------- The following portions of the patient's history were reviewed and updated as appropriate: allergies, current medications, past family history, past medical history, past social history, past surgical history and problem list. Problem list updated.   Objective  Blood pressure 104/68, weight 129 lb (58.5 kg). Pregravid weight 101 lb (45.8 kg) Total Weight Gain 28 lb (12.7 kg) Urinalysis: No sample Fetal Status: Fetal Heart Rate (bpm): 150   Movement: Present  Presentation: Complete Breech  General:  Alert, oriented and cooperative. Patient is in no acute distress.  Skin: Skin is warm and dry. No rash noted.   Cardiovascular: Normal heart rate noted  Respiratory: Normal respiratory effort, no problems with respiration noted  Abdomen: Soft, gravid, appropriate for gestational age. Pain/Pressure: Absent     Pelvic:  Cervical exam deferred        Extremities: Normal range of motion.     Mental Status: Normal mood and affect. Normal behavior. Normal judgment and thought content.   NST: Baseline: 135 Variability:  moderate Accelerations: present Decelerations: absent Tocometry: none Fetal heart rate tracing was deemed reactive, category I tracing.   Assessment   27 y.o. G1P0 at [redacted]w[redacted]d, EDD 08/27/2017 by Other Basis presenting for routine prenatal visit.  Plan   pregancy Problems (from 05/23/17 to present)    Problem Noted Resolved   Supervision of normal first pregnancy, antepartum 05/28/2017 by Natale Milch, MD No   Overview Addendum 07/23/2017  5:19 PM by Natale Milch, MD      Clinic [facility] Prenatal Labs  Dating  20wk [place] Blood type: A/Positive/-- (01/21 0000)   Genetic Screen  NIPS:   Normal XX Antibody:Negative (01/21 0000)  Anatomic US  Renal pyelectasis Rubella: Nonimmune (01/21 0000) Varicella:   GTT Early:        3hr:  passed    RPR: Nonreactive (01/21 0000)   Rhogam  not applicable HBsAg: Negative (01/21 0000)   TDaP vaccine   06/19/17                    HIV: Non-reactive (01/21 0000)   Flu Shot  Declines                              GBS:   Contraception  Nexplanon? Pap: NIL 2019  CBB   Given information   CS/VBAC  not applicable   Baby Food  Breast   Support Person              NST reactive, AFI 3.59 with no 2x2 pocket, breech. Previously inpatient for oligohydramnios from 5/2 to 5/4 with IV hydration and unscucessful ECV.  Dr. Tiburcio Pea discussed the need for Cesarean delivery with the patient given persistent oligohydramnios and breech presentation.  She will go to Labor and Delivery following this visit for monitoring and delivery.  Marcelyn Bruins, CNM 08/15/2017  10:31 AM

## 2017-08-15 NOTE — H&P (Signed)
Obstetrics Admission History & Physical   CC: Oligo, Term, Breech  HPI:  26 y.o. G1P0 @ [redacted]w[redacted]d (08/27/2017, by Other Basis). Admitted on 08/15/2017:   Patient Active Problem List   Diagnosis Date Noted  . Oligohydramnios in third trimester 08/15/2017  . Breech presentation, no version 08/15/2017  . Malpresentation before onset of labor 08/10/2017  . Rubella non-immune status, antepartum 05/28/2017  . Supervision of normal first pregnancy, antepartum 05/28/2017  . Anemia 05/28/2017  . Ingrown right big toenail 12/24/2012  . Contraception management 12/24/2012  . Generalized anxiety disorder 11/12/2012  . OCD (obsessive compulsive disorder) 11/12/2012  . Asperger's disorder 11/12/2012    Presents for concerns over position of baby and ongoing oligohydramnios.  Pt was seen and treated w IVF last week which helped with oligo, but then on repeat US today it has worsened.  Fetus has been breech on all occasions.  Unable to perform ECV.  Pt is full term with concerns over causes and effects of oligo on pregnancy and fetus; to have CS for delivery.   Prenatal care at: at [facility]. Pregnancy complicated by: She is currently monitored for the following issues for this high-risk pregnancy and has Generalized anxiety disorder; OCD (obsessive compulsive disorder); Asperger's disorder.  ROS: A review of systems was performed and negative, except as stated in the above HPI. PMHx:  Past Medical History:  Diagnosis Date  . Asperger syndrome 2009  . Asthma   . History of ADHD   . History of anxiety   . History of chicken pox    around age 68  . OCD (obsessive compulsive disorder)   . Psoriasis of scalp    PSHx:  Past Surgical History:  Procedure Laterality Date  . NO PAST SURGERIES     Medications:  Medications Prior to Admission  Medication Sig Dispense Refill Last Dose  . acetaminophen (TYLENOL) 500 MG tablet Take 2 tablets (1,000 mg total) by mouth every 6 (six) hours as needed for fever or  headache. 30 tablet 0   . ferrous sulfate (FERROUSUL) 325 (65 FE) MG tablet Take 1 tablet (325 mg total) by mouth 2 (two) times daily. 60 tablet 11   . fluocinolone (VANOS) 0.01 % cream Apply topically 2 (two) times daily. 60 g 1 Taking  . Fluocinolone Acetonide Scalp 0.01 % OIL Use daily on scalp prn for psoriasis 120 mL 3 Taking  . fluticasone (FLONASE) 50 MCG/ACT nasal spray Place 2 sprays into both nostrils daily. 16 g 6 Taking  . Prenatal Vit-Fe Fumarate-FA (PRENATAL MULTIVITAMIN) TABS tablet Take 1 tablet by mouth daily at 12 noon.   Taking   Allergies: has No Known Allergies. OBHx:  OB History  Gravida Para Term Preterm AB Living  1            SAB TAB Ectopic Multiple Live Births               # Outcome Date GA Lbr Len/2nd Weight Sex Delivery Anes PTL Lv  1 Current            NWG:NFAOZHYQ/MVHQIONGEXBM except as detailed in HPI.Marland Kitchen  No family history of birth defects. Soc Hx: Alcohol: none, Recreational drug use: none and Denies domestic abuse  Objective:   Vitals:   08/15/17 1158  BP: 135/66  Pulse: 77  Resp: 18  Temp: 98.2 F (36.8 C)   Constitutional: Well nourished, well developed female in no acute distress.  HEENT: normal Skin: Warm and dry.  Cardiovascular:Regular rate and rhythm.  Extremity: trace to 1+ bilateral pedal edema Respiratory: Clear to auscultation bilateral. Normal respiratory effort Abdomen: no T, gravid but ND, FHT 140s Back: no CVAT Neuro: DTRs 2+, Cranial nerves grossly intact Psych: Alert and Oriented x3. No memory deficits. Normal mood and affect.  MS: normal gait, normal bilateral lower extremity ROM/strength/stability.  EFM:FHR: 140 bpm, variability: moderate,  accelerations:  Present,  decelerations:  Absent Toco: rare ctx activity   Perinatal info:  Blood type: A positive Rubella- Not immune Varicella -Immune TDaP Given during third trimester of this pregnancy RPR NR / HIV Neg/ HBsAg Neg   Assessment & Plan:   27 y.o. G1P0 @  [redacted]w[redacted]d, Admitted on 08/15/2017:  Oligohydramnios, Breech Options d/w pt, will proceed w Cesarean Delivery due to risks of oligo. The risks of cesarean section discussed with the patient included but were not limited to: bleeding which may require transfusion or reoperation; infection which may require antibiotics; injury to bowel, bladder, ureters or other surrounding organs; injury to the fetus; need for additional procedures including hysterectomy in the event of a life-threatening hemorrhage; placental abnormalities wth subsequent pregnancies, incisional problems, thromboembolic phenomenon and other postoperative/anesthesia complications. The patient concurred with the proposed plan, giving informed written consent for the procedure.   Annamarie Major, MD, [facility], [facility] 08/15/2017  12:52 PM

## 2017-08-15 NOTE — Discharge Instructions (Signed)

## 2017-08-16 ENCOUNTER — Encounter: Payer: Self-pay | Admitting: Obstetrics & Gynecology

## 2017-08-16 LAB — CBC
HCT: 31.7 % — ABNORMAL LOW (ref 35.0–47.0)
HEMOGLOBIN: 10.9 g/dL — AB (ref 12.0–16.0)
MCH: 33 pg (ref 26.0–34.0)
MCHC: 34.5 g/dL (ref 32.0–36.0)
MCV: 95.7 fL (ref 80.0–100.0)
Platelets: 170 10*3/uL (ref 150–440)
RBC: 3.32 MIL/uL — ABNORMAL LOW (ref 3.80–5.20)
RDW: 13.5 % (ref 11.5–14.5)
WBC: 9.6 10*3/uL (ref 3.6–11.0)

## 2017-08-16 LAB — RPR: RPR: NONREACTIVE

## 2017-08-16 MED ORDER — PRENATAL MULTIVITAMIN CH
1.0000 | ORAL_TABLET | Freq: Every day | ORAL | Status: DC
  Administered 2017-08-16 – 2017-08-18 (×3): 1 via ORAL
  Filled 2017-08-16 (×3): qty 1

## 2017-08-16 MED ORDER — DIBUCAINE 1 % RE OINT: 1.0000 "application " | TOPICAL_OINTMENT | RECTAL | Status: DC | PRN

## 2017-08-16 MED ORDER — DIPHENHYDRAMINE HCL 25 MG PO CAPS
25.0000 mg | ORAL_CAPSULE | Freq: Four times a day (QID) | ORAL | Status: DC | PRN
  Administered 2017-08-16: 25 mg via ORAL

## 2017-08-16 MED ORDER — COCONUT OIL OIL
1.0000 "application " | TOPICAL_OIL | Status: DC | PRN
  Administered 2017-08-16: 1 via TOPICAL
  Filled 2017-08-16: qty 120

## 2017-08-16 MED ORDER — OXYTOCIN 40 UNITS IN LACTATED RINGERS INFUSION - SIMPLE MED
INTRAVENOUS | Status: AC
  Filled 2017-08-16: qty 1000

## 2017-08-16 MED ORDER — LACTATED RINGERS IV SOLN: INTRAVENOUS | Status: DC

## 2017-08-16 MED ORDER — SENNOSIDES-DOCUSATE SODIUM 8.6-50 MG PO TABS
2.0000 | ORAL_TABLET | ORAL | Status: DC
  Administered 2017-08-16 – 2017-08-18 (×3): 2 via ORAL
  Filled 2017-08-16 (×3): qty 2

## 2017-08-16 MED ORDER — NALBUPHINE HCL 10 MG/ML IJ SOLN
2.5000 mg | Freq: Once | INTRAMUSCULAR | Status: AC
  Administered 2017-08-16: 2.5 mg via INTRAVENOUS
  Filled 2017-08-16: qty 1

## 2017-08-16 MED ORDER — KETOROLAC TROMETHAMINE 30 MG/ML IJ SOLN
INTRAMUSCULAR | Status: AC
  Administered 2017-08-16: 30 mg via INTRAVENOUS
  Filled 2017-08-16: qty 1

## 2017-08-16 MED ORDER — ACETAMINOPHEN 325 MG PO TABS
650.0000 mg | ORAL_TABLET | ORAL | Status: DC | PRN
  Administered 2017-08-16: 650 mg via ORAL
  Filled 2017-08-16 (×2): qty 2

## 2017-08-16 MED ORDER — MEASLES, MUMPS & RUBELLA VAC ~~LOC~~ INJ
0.5000 mL | INJECTION | Freq: Once | SUBCUTANEOUS | Status: AC
  Administered 2017-08-18: 0.5 mL via SUBCUTANEOUS
  Filled 2017-08-16 (×2): qty 0.5

## 2017-08-16 MED ORDER — OXYCODONE-ACETAMINOPHEN 5-325 MG PO TABS
1.0000 | ORAL_TABLET | ORAL | Status: DC | PRN
  Administered 2017-08-16 – 2017-08-17 (×2): 1 via ORAL
  Filled 2017-08-16 (×2): qty 1

## 2017-08-16 MED ORDER — KETOROLAC TROMETHAMINE 30 MG/ML IJ SOLN
30.0000 mg | Freq: Four times a day (QID) | INTRAMUSCULAR | Status: AC
  Administered 2017-08-16 (×4): 30 mg via INTRAVENOUS
  Filled 2017-08-16 (×3): qty 1

## 2017-08-16 MED ORDER — SODIUM CHLORIDE FLUSH 0.9 % IV SOLN
INTRAVENOUS | Status: AC
  Filled 2017-08-16: qty 10

## 2017-08-16 MED ORDER — MORPHINE SULFATE (PF) 2 MG/ML IV SOLN: 1.0000 mg | INTRAVENOUS | Status: DC | PRN

## 2017-08-16 MED ORDER — SIMETHICONE 80 MG PO CHEW
80.0000 mg | CHEWABLE_TABLET | Freq: Three times a day (TID) | ORAL | Status: DC
  Administered 2017-08-16 – 2017-08-18 (×7): 80 mg via ORAL
  Filled 2017-08-16 (×7): qty 1

## 2017-08-16 MED ORDER — WITCH HAZEL-GLYCERIN EX PADS: 1.0000 "application " | MEDICATED_PAD | CUTANEOUS | Status: DC | PRN

## 2017-08-16 MED ORDER — MENTHOL 3 MG MT LOZG
1.0000 | LOZENGE | OROMUCOSAL | Status: DC | PRN
  Filled 2017-08-16: qty 9

## 2017-08-16 MED ORDER — SIMETHICONE 80 MG PO CHEW: 80.0000 mg | CHEWABLE_TABLET | ORAL | Status: DC

## 2017-08-16 MED ORDER — OXYTOCIN 40 UNITS IN LACTATED RINGERS INFUSION - SIMPLE MED
2.5000 [IU]/h | INTRAVENOUS | Status: AC
  Filled 2017-08-16: qty 1000

## 2017-08-16 MED ORDER — SIMETHICONE 80 MG PO CHEW
80.0000 mg | CHEWABLE_TABLET | ORAL | Status: DC | PRN
Start: 2017-08-16 — End: 2017-08-18
  Filled 2017-08-16: qty 1

## 2017-08-16 MED ORDER — ZOLPIDEM TARTRATE 5 MG PO TABS: 5.0000 mg | ORAL_TABLET | Freq: Every evening | ORAL | Status: DC | PRN

## 2017-08-16 MED ORDER — DIPHENHYDRAMINE HCL 25 MG PO CAPS
ORAL_CAPSULE | ORAL | Status: AC
  Administered 2017-08-16: 25 mg via ORAL
  Filled 2017-08-16: qty 1

## 2017-08-16 MED ORDER — OXYCODONE-ACETAMINOPHEN 5-325 MG PO TABS
2.0000 | ORAL_TABLET | ORAL | Status: DC | PRN
  Administered 2017-08-17 – 2017-08-18 (×7): 2 via ORAL
  Filled 2017-08-16 (×7): qty 2

## 2017-08-16 NOTE — Anesthesia Post-op Follow-up Note (Signed)
  Anesthesia Pain Follow-up Note  Patient: Gloria Morris  Day #: 1  Date of Follow-up: 08/16/2017 Time: 8:55 AM  Last Vitals:  Vitals:   08/16/17 0300 08/16/17 0824  BP: (!) 118/51 115/66  Pulse: 75 77  Resp: 18 18  Temp: 37.2 C 36.8 C  SpO2:  98%    Level of Consciousness: alert  Pain: none   Side Effects:None  Catheter Site Exam:clean, dry, no drainage     Plan: D/C from anesthesia care at surgeon's request  Karoline Caldwell

## 2017-08-16 NOTE — Anesthesia Postprocedure Evaluation (Signed)
Anesthesia Post Note  Patient: Gloria Morris  Procedure(s) Performed: CESAREAN SECTION (N/A Abdomen)  Patient location during evaluation: Mother Baby Anesthesia Type: Spinal Level of consciousness: awake, awake and alert and oriented Pain management: pain level controlled Vital Signs Assessment: post-procedure vital signs reviewed and stable Respiratory status: spontaneous breathing Cardiovascular status: blood pressure returned to baseline Postop Assessment: no headache, no backache, no apparent nausea or vomiting and adequate PO intake Anesthetic complications: no     Last Vitals:  Vitals:   08/16/17 0300 08/16/17 0824  BP: (!) 118/51 115/66  Pulse: 75 77  Resp: 18 18  Temp: 37.2 C 36.8 C  SpO2:  98%    Last Pain:  Vitals:   08/16/17 0824  TempSrc: Oral  PainSc:                  Karoline Caldwell

## 2017-08-16 NOTE — Progress Notes (Signed)
POD#1 pLTCS Subjective:  Sitting up in bed, well-appearing. Verbalizes good pain control, has not yet been out of bed. Tolerating regular diet. Desires to ambulate.  Objective:  Blood pressure 129/68, pulse 78, temperature 98.8 F (37.1 C), temperature source Oral, resp. rate 18, height  (1.651 m), weight 129 lb (58.5 kg), SpO2 98 %,currently breastfeeding.  General: NAD Pulmonary: no increased work of breathing Abdomen: non-distended, non-tender, fundus firm U/1, lochia appropriate Incision: Dressing C/D/I, On-Q pump in place Extremities: no edema, no erythema, no tenderness  Results for orders placed or performed during the hospital encounter of 08/15/17 (from the past 72 hour(s))  CBC     Status: Abnormal   Collection Time: 08/15/17  1:18 PM  Result Value Ref Range   WBC 8.4 3.6 - 11.0 K/uL   RBC 3.46 (L) 3.80 - 5.20 MIL/uL   Hemoglobin 11.5 (L) 12.0 - 16.0 g/dL   HCT 65.7 (L) 84.6 - 96.2 %   MCV 95.8 80.0 - 100.0 fL   MCH 33.3 26.0 - 34.0 pg   MCHC 34.8 32.0 - 36.0 g/dL   RDW 95.2 84.1 - 32.4 %   Platelets 190 150 - 440 K/uL    Comment: Performed at [facility], [address] [place], [place] [address]  Type and screen [facility]     Status: None   Collection Time: 08/15/17  1:18 PM  Result Value Ref Range   ABO/RH(D) A POS    Antibody Screen NEG    Sample Expiration      08/18/2017 Performed at [facility], [address] [place], [place] [address]   RPR     Status: None   Collection Time: 08/15/17  1:18 PM  Result Value Ref Range   RPR Ser Ql Non Reactive Non Reactive    Comment: (NOTE) Performed At: [facility] [address] [place], [place] [id number] Jolene Schimke MD [phone] Performed at [facility], [address] [place], [place] [address]   CBC     Status: Abnormal   Collection Time: 08/16/17  5:50 AM  Result Value Ref Range   WBC 9.6 3.6 - 11.0 K/uL   RBC 3.32 (L) 3.80 -  5.20 MIL/uL   Hemoglobin 10.9 (L) 12.0 - 16.0 g/dL   HCT 84.1 (L) 66.0 - 63.0 %   MCV 95.7 80.0 - 100.0 fL   MCH 33.0 26.0 - 34.0 pg   MCHC 34.5 32.0 - 36.0 g/dL   RDW 16.0 10.9 - 32.3 %   Platelets 170 150 - 440 K/uL    Comment: Performed at [facility], [address], [place], [place] [address]     Assessment:   27 y.o. G1P1001 post-operative day # 1 recovering well.   Plan:  1) Acute blood loss anemia - hemodynamically stable and asymptomatic  2) Blood Type --/--/A POS (05/08 1318) / Rubella Nonimmune (01/21 0000) / Varicella immune  3) TDAP status - received 06/19/2017  4) Breastfeeding  5) Disposition: remove Foley and ambulate if patient steady after standing at side of bed. Continue postpartum care.  Marcelyn Bruins, CNM 08/16/2017  1:57 PM

## 2017-08-17 MED ORDER — IBUPROFEN 600 MG PO TABS
600.0000 mg | ORAL_TABLET | Freq: Four times a day (QID) | ORAL | Status: DC
  Administered 2017-08-17 – 2017-08-18 (×4): 600 mg via ORAL
  Filled 2017-08-17 (×5): qty 1

## 2017-08-17 NOTE — Progress Notes (Signed)
  Subjective:   Post Op Day 2 Patient is tolerating PO intake. Her pain is managed with PO medications and On Q pump. Her pain level is elevated at time of visit but states that generally it is controlled well. She is ambulating and voiding without difficulty. Breastfeeding is going well.  Objective:  Blood pressure 133/76, pulse 72, temperature 98.5 F (36.9 C), temperature source Oral, resp. rate 20, height  (1.651 m), weight 129 lb (58.5 kg), SpO2 99 %, currently breastfeeding.  General: NAD Pulmonary: no increased work of breathing Abdomen: non-distended, non-tender, fundus firm at level of umbilicus Incision: honeycomb dressing is C/D/I, On Q pump intact Extremities: no edema, no erythema, no tenderness   Intake/Output Summary (Last 24 hours) at 08/17/2017 1022 Last data filed at 08/16/2017 1840 Gross per 24 hour  Intake 1049.6 ml  Output 825 ml  Net 224.6 ml     Assessment:   27 y.o. G1P1001 postoperativeday # 2   Plan:  1) Acute blood loss anemia - hemodynamically stable and asymptomatic - po ferrous sulfate  2) A positive, Rubella Non-Immune (patient accepts vaccination prior to discharge, Varicella Immune  3) TDAP status: given antepartum  4) Breast/Bottle/Contraception: considering progesterone-only pill or Depo  5) Disposition: discharge to home tomorrow   Tresea Mall, CNM

## 2017-08-18 MED ORDER — OXYCODONE-ACETAMINOPHEN 5-325 MG PO TABS: 1.0000 | ORAL_TABLET | Freq: Four times a day (QID) | ORAL | 0 refills | Status: DC | PRN

## 2017-08-18 NOTE — Lactation Note (Signed)
This note was copied from a baby's chart. Benefits of breastfeeding reviewed. Patient reports being sore and preferring to pump and bottle feed for now. Storage guidelines reviewed. Repeatedly stated she does not plan to give any formula once her "milk comes in". Using coconut oil for comfort.

## 2017-08-18 NOTE — Plan of Care (Signed)
Vs stable; up ad lib; encouraged more involvement with baby care; breastfeeding and formula feeding afterwards; taking motrin and percocet for pain control

## 2017-08-18 NOTE — Progress Notes (Signed)
Discharge instructions complete and prescriptions given. Patient verbalizes understanding of teaching. Patient discharged home via wheelchair at 1515.

## 2017-08-20 ENCOUNTER — Encounter: Payer: Medicaid Other | Admitting: Obstetrics and Gynecology

## 2017-08-20 ENCOUNTER — Inpatient Hospital Stay: Admission: RE | Admit: 2017-08-20 | Payer: Medicaid Other | Source: Ambulatory Visit

## 2017-08-22 MED ORDER — BUPIVACAINE IN DEXTROSE 0.75-8.25 % IT SOLN
INTRATHECAL | Status: DC | PRN
  Administered 2017-08-15: 1.75 mL via INTRATHECAL

## 2017-08-22 NOTE — Anesthesia Procedure Notes (Signed)
Spinal  Start time: 08/15/2017 8:05 PM End time: 08/22/2017 8:11 PM Staffing Resident/CRNA: Omer Jack, CRNA Performed: resident/CRNA  Preanesthetic Checklist Completed: patient identified, site marked, surgical consent, pre-op evaluation, timeout performed, IV checked, risks and benefits discussed and monitors and equipment checked Spinal Block Patient position: sitting Prep: Betadine and site prepped and draped Patient monitoring: heart rate, cardiac monitor, continuous pulse ox and blood pressure Location: L3-4 Injection technique: single-shot Needle Needle type: Whitacre  Needle gauge: 25 G Assessment Sensory level: T4 Additional Notes Time out called.  Patient placed in sitting position.  Back prepped and draped in sterile fashion.  A skin wheal was made in the L3-L4 interspace with 1% Lidocaine plain.  A25G Whitacre needle was advanced with the return of clear, colorless CSF in all 4 quadrants.  1.75 cc of spinal marcaine with an epi wash and 0.1 mg of Astromorph was injected.  The patient tolerated the procedure well.

## 2017-08-22 NOTE — Addendum Note (Signed)
Addendum  created 08/22/17 1221 by Yves Dill, MD   Child order released for a procedure order, Intraprocedure Blocks edited, Intraprocedure Event edited, Intraprocedure Meds edited, Sign clinical note

## 2017-08-23 ENCOUNTER — Encounter: Payer: Self-pay | Admitting: Obstetrics & Gynecology

## 2017-08-23 ENCOUNTER — Ambulatory Visit (INDEPENDENT_AMBULATORY_CARE_PROVIDER_SITE_OTHER): Payer: Medicaid Other | Admitting: Obstetrics & Gynecology

## 2017-08-23 VITALS — BP 120/80 | Ht 65.0 in | Wt 112.0 lb

## 2017-08-23 DIAGNOSIS — O34219 Maternal care for unspecified type scar from previous cesarean delivery: Secondary | ICD-10-CM

## 2017-08-23 MED ORDER — MEDROXYPROGESTERONE ACETATE 150 MG/ML IM SUSP: 150.0000 mg | INTRAMUSCULAR | 0 refills | Status: DC

## 2017-08-23 NOTE — Progress Notes (Signed)
  Postoperative Follow-up Patient presents post op from CS for Breech and Oligo, 1 week ago.  Subjective: Patient reports marked improvement in her preop symptoms. Eating a regular diet without difficulty. The patient is not having any pain.  Activity: sedentary. Patient reports vaginal sx's of Irregular bleeding  Objective: BP 120/80   Ht  (1.651 m)   Wt 112 lb (50.8 kg)   BMI 18.64 kg/m  Physical Exam  Constitutional: She is oriented to person, place, and time. She appears well-developed and well-nourished. No distress.  Cardiovascular: Normal rate.  Pulmonary/Chest: Effort normal.  Abdominal: Soft. She exhibits no distension. There is no tenderness.  Incision Healing Well   Musculoskeletal: Normal range of motion.  Neurological: She is alert and oriented to person, place, and time. No cranial nerve deficit.  Skin: Skin is warm and dry.  Psychiatric: She has a normal mood and affect.   Assessment: s/p :  cesarean section progressing well  Plan: Patient has done well after surgery with no apparent complications.  I have discussed the post-operative course to date, and the expected progress moving forward.  The patient understands what complications to be concerned about.  I will see the patient in routine follow up, or sooner if needed.    Activity plan: No heavy lifting. Pelvic rest No depression, just some blues Breast feeding OK Plans DEPO for BC  Letitia Libra 08/23/2017, 11:34 AM

## 2017-08-24 ENCOUNTER — Encounter: Payer: Medicaid Other | Admitting: Obstetrics and Gynecology

## 2017-09-27 ENCOUNTER — Ambulatory Visit (INDEPENDENT_AMBULATORY_CARE_PROVIDER_SITE_OTHER): Payer: Medicaid Other | Admitting: Obstetrics & Gynecology

## 2017-09-27 ENCOUNTER — Encounter: Payer: Self-pay | Admitting: Obstetrics & Gynecology

## 2017-09-27 MED ORDER — SERTRALINE HCL 50 MG PO TABS: 50.0000 mg | ORAL_TABLET | Freq: Every day | ORAL | 11 refills | Status: DC

## 2017-09-27 NOTE — Progress Notes (Signed)
  OBSTETRICS POSTPARTUM CLINIC PROGRESS NOTE  Subjective:     Gloria Morris is a 27 y.o. 751P1001 female who presents for a postpartum visit. She is 6 weeks postpartum following a Term pregnancy and delivery by C-section.  I have fully reviewed the prenatal and intrapartum course. Anesthesia: spinal.  Postpartum course has been complicated by uncomplicated.  Baby is feeding by Bottle and Breast.  Bleeding: patient has not  resumed menses.  Bowel function is normal. Bladder function is normal.  Patient is not sexually active. Contraception method desired is abstinence.  Postpartum depression screening: negative. Edinburgh 8.  The following portions of the patient's history were reviewed and updated as appropriate: allergies, current medications, past family history, past medical history, past social history, past surgical history and problem list.  Review of Systems Pertinent items are noted in HPI.  Objective:    BP 120/60   Ht 5\' 5"  (1.651 m)   Wt 105 lb (47.6 kg)   BMI 17.47 kg/m   General:  alert and no distress   Breasts:  inspection negative, no nipple discharge or bleeding, no masses or nodularity palpable  Lungs: clear to auscultation bilaterally  Heart:  regular rate and rhythm, S1, S2 normal, no murmur, click, rub or gallop  Abdomen: soft, non-tender; bowel sounds normal; no masses,  no organomegaly.  Well healed Pfannenstiel incision   Vulva:  normal  Vagina: normal vagina, no discharge, exudate, lesion, or erythema  Cervix:  no cervical motion tenderness and no lesions  Corpus: normal size, contour, position, consistency, mobility, non-tender  Adnexa:  normal adnexa and no mass, fullness, tenderness  Rectal Exam: Not performed.          Assessment:  Post Partum Care visit 1. Postpartum care and examination  Plan:  See orders and Patient Instructions Contraceptive counseling for abstinence Resume all normal activities Follow up in: 7 months or as  needed.  Plan OCP or minipill if becomes active Zoloft for anxiety following pregnancy and delivery  Annamarie MajorPaul Lamin Chandley, MD, [facility] FrederickFACOG [facility], [facility] 09/27/2017  11:35 AM

## 2017-09-28 ENCOUNTER — Ambulatory Visit: Payer: Self-pay | Admitting: Family Medicine

## 2017-10-01 ENCOUNTER — Ambulatory Visit: Payer: Medicaid Other | Admitting: Family Medicine

## 2017-10-08 ENCOUNTER — Ambulatory Visit: Payer: Medicaid Other

## 2018-02-13 ENCOUNTER — Encounter: Payer: Self-pay | Admitting: Family Medicine

## 2018-02-13 ENCOUNTER — Ambulatory Visit: Payer: Self-pay | Admitting: Family Medicine

## 2018-02-13 VITALS — BP 104/69 | HR 76 | Temp 98.1°F | Wt 101.6 lb

## 2018-02-13 DIAGNOSIS — M62830 Muscle spasm of back: Secondary | ICD-10-CM

## 2018-02-13 MED ORDER — MELOXICAM 7.5 MG PO TABS
7.5000 mg | ORAL_TABLET | Freq: Every day | ORAL | 0 refills | Status: DC
Start: 2018-02-13 — End: 2018-03-08

## 2018-02-13 MED ORDER — CYCLOBENZAPRINE HCL 5 MG PO TABS: 5.0000 mg | ORAL_TABLET | Freq: Three times a day (TID) | ORAL | 0 refills | Status: DC | PRN
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
## 2018-02-13 NOTE — Patient Instructions (Signed)

## 2018-03-08 ENCOUNTER — Other Ambulatory Visit: Payer: Self-pay | Admitting: Family Medicine

## 2018-08-30 ENCOUNTER — Telehealth: Payer: Self-pay

## 2018-08-30 NOTE — Telephone Encounter (Signed)
Pt called wanting to know what to do for a yeast inf.  [phone]  Adv Monistat 3d or 7d.  If doesn't help to schedule appt and not use anything the night before appt.

## 2018-09-19 IMAGING — US US OB COMP +14 WK
1 series · 13 of 28 positions shown · non-contrast
Comparison: none

CLINICAL DATA: Current assigned gestational age of is 37 weeks 4
days. Oligohydramnios. Breech presentation. Evaluate fetal growth
and amniotic fluid.

EXAM:
OBSTETRICAL ULTRASOUND >14 WKS

[Series 1: us ob comp +14 wk · 13 of 91 slices shown]
[im 4/91]
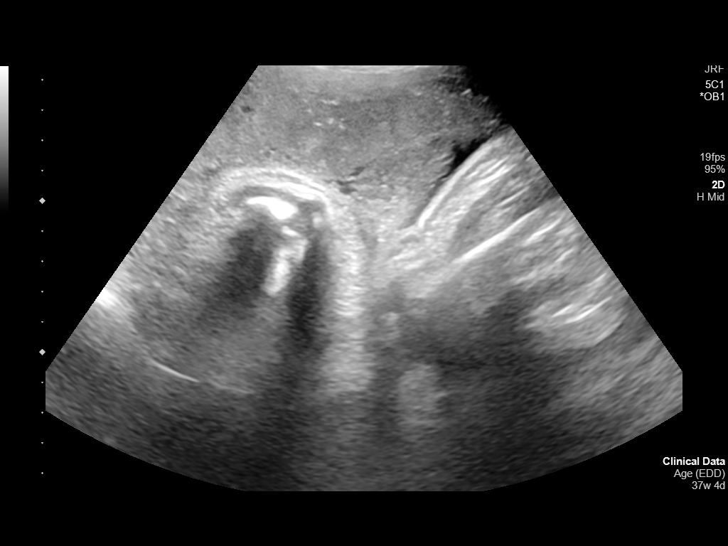
[im 11/91]
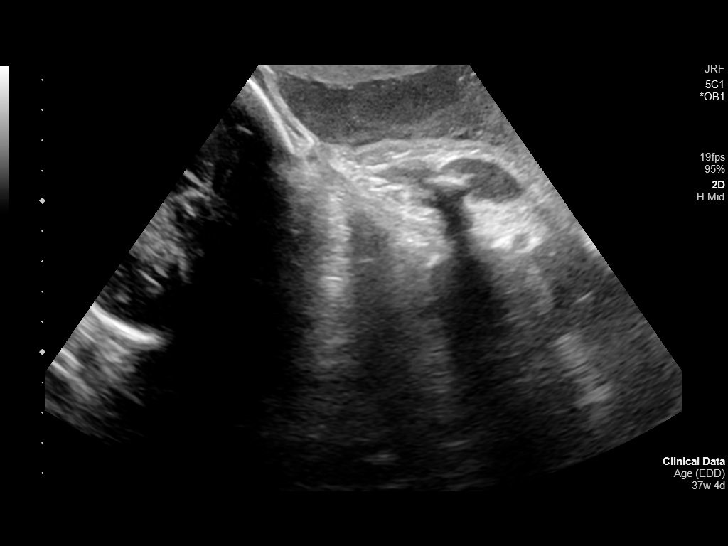
[im 17/91]
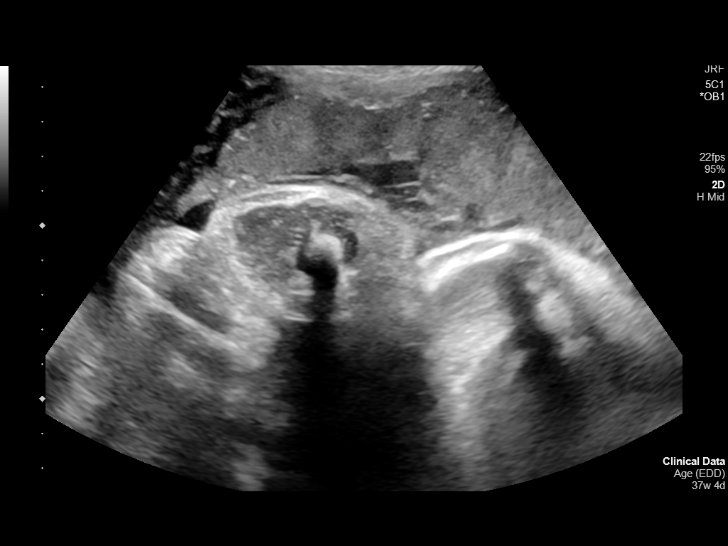
[im 24/91]
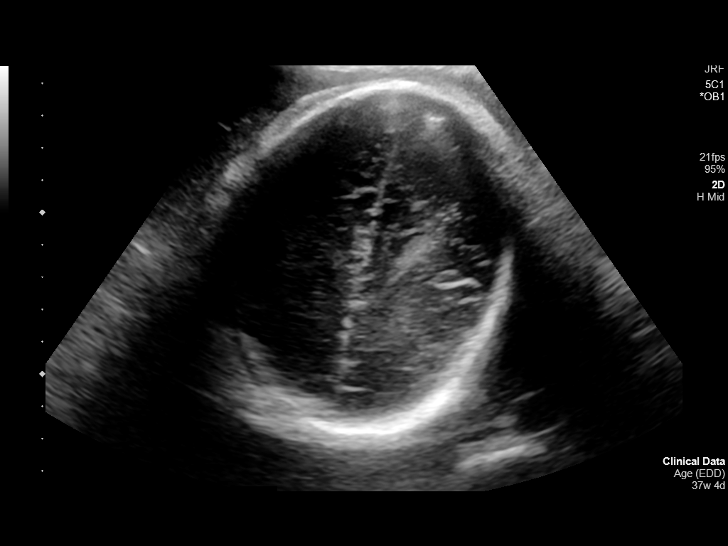
[im 31/91]
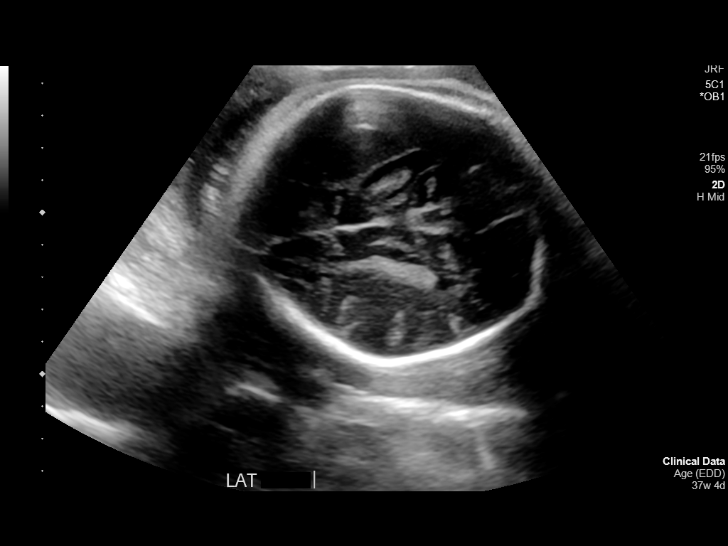
[im 37/91]
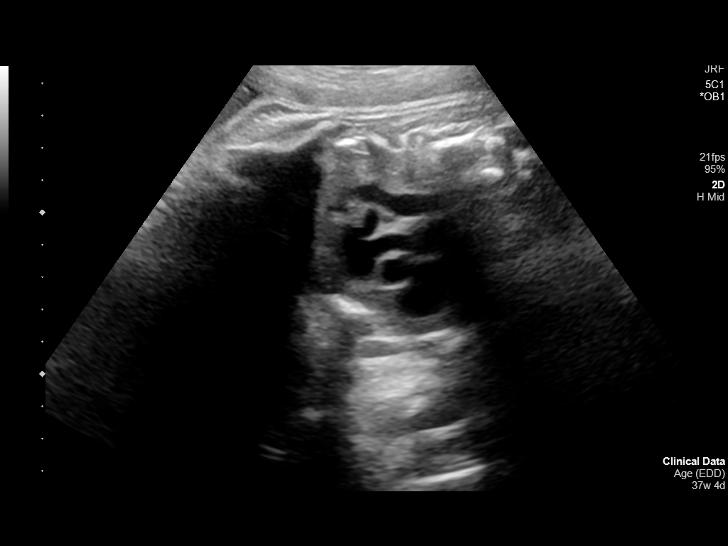
[im 47/91]
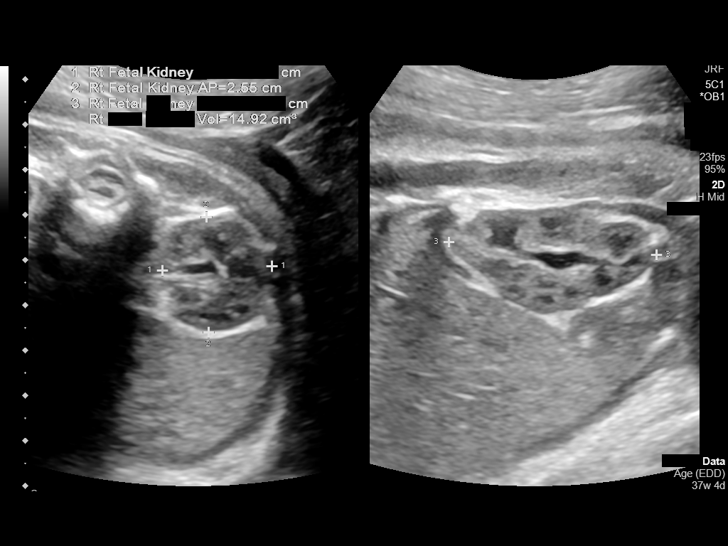
[im 54/91]
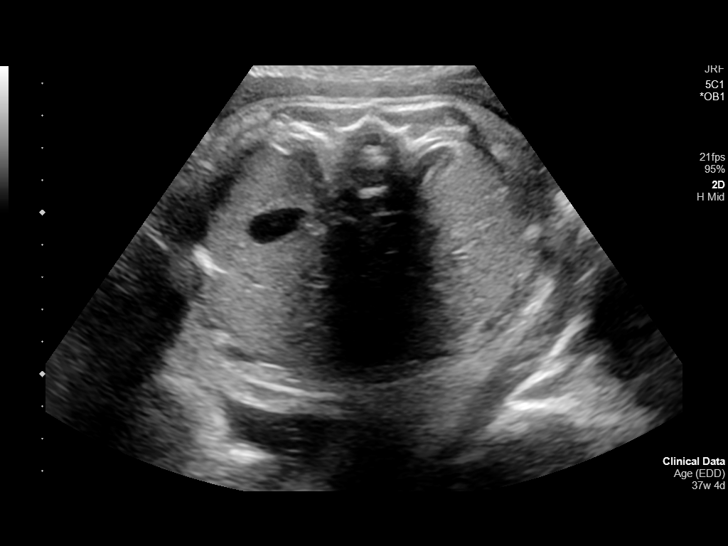
[im 61/91]
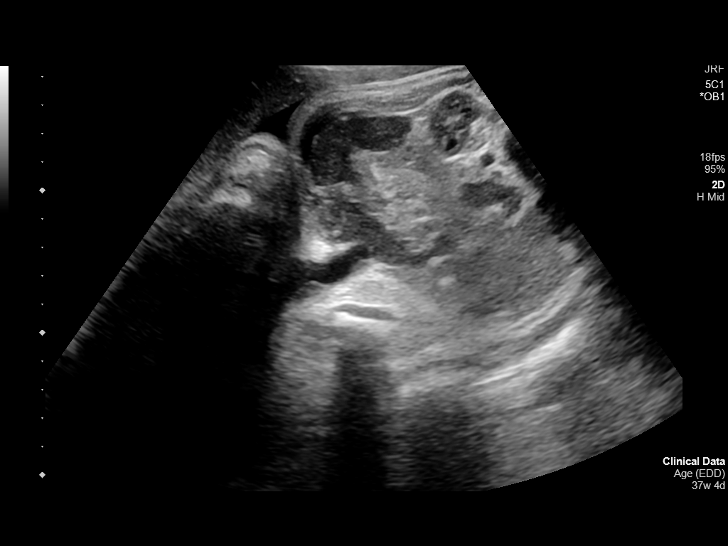
[im 67/91]
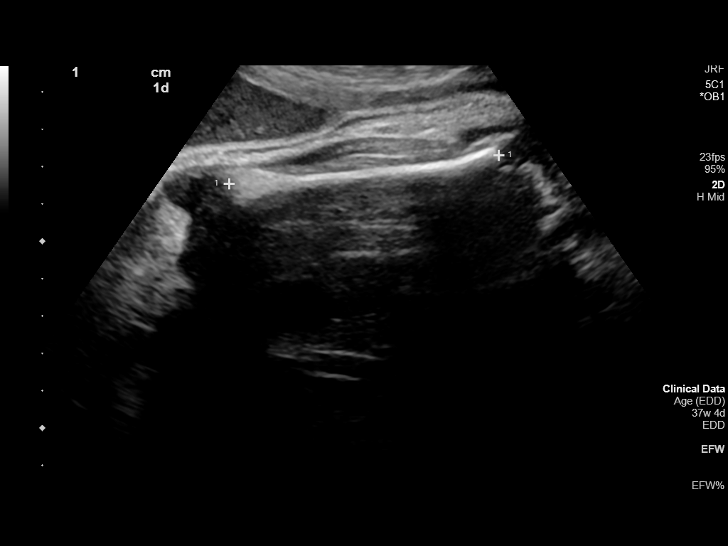
[im 74/91]
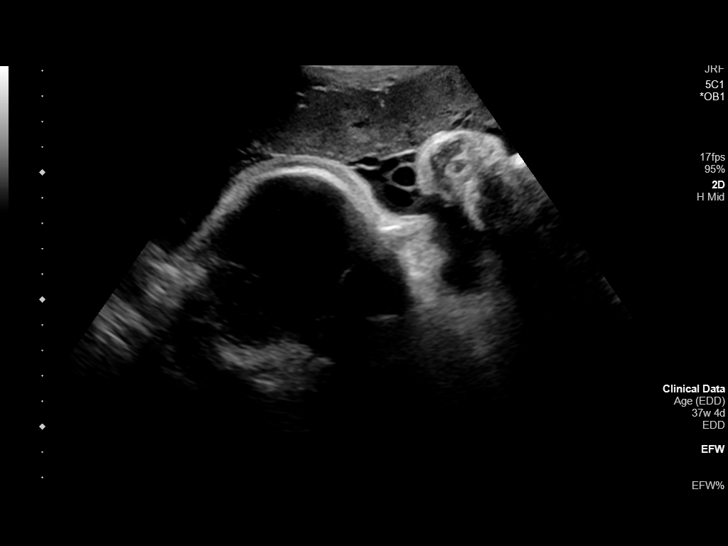
[im 81/91]
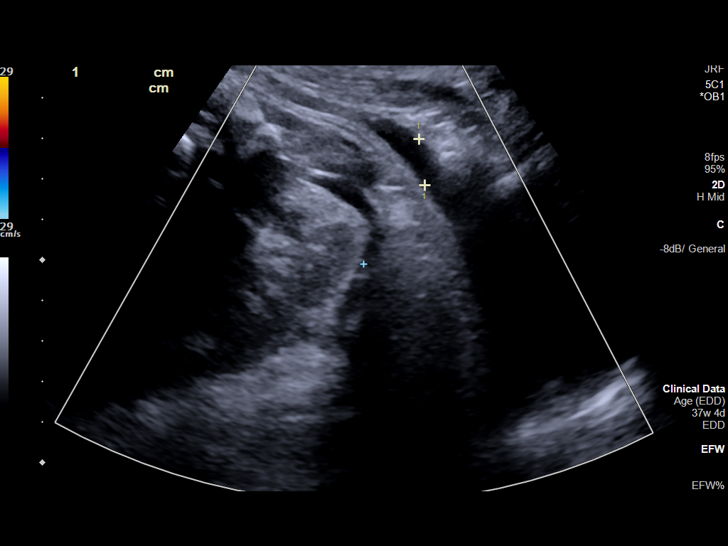
[im 87/91]
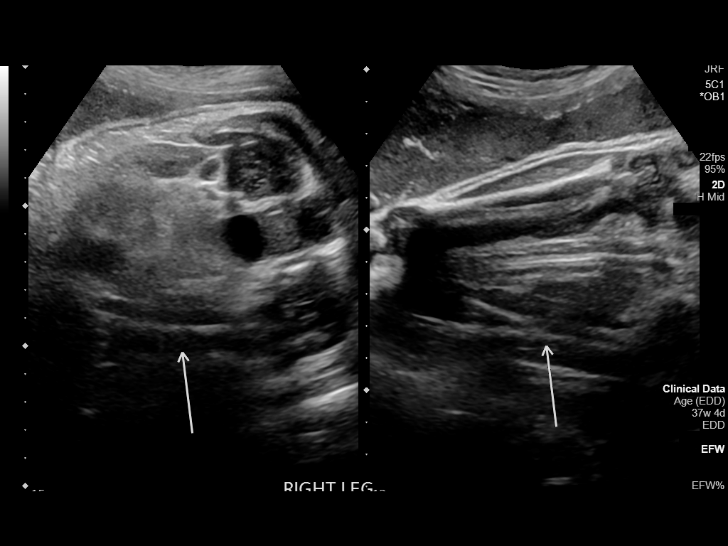

[13 of 28 positions shown; findings below may reference images not displayed]

FINDINGS: Number of Fetuses: 1

Heart Rate:  121 bpm

Movement: Yes

Presentation: Breech

Previa: No

Placental Location: Anterior

Amniotic Fluid (Subjective): Decreased

Amniotic Fluid (Objective):

Vertical pocket 5.1cm

AFI 12.2 cm (5%ile= 7.5 cm, 95%= 24.4 cm for 37 wks)

FETAL BIOMETRY

BPD:  8.8cm 35w 5d

HC:    32.7cm 37w 1d

AC:   35.0cm 38w 6d

FL:   7.2cm 37w 0d

Current Mean GA: 37w 0d US EDC: 08/31/2017

Estimated Fetal Weight:  3,332g 68%ile

FETAL ANATOMY

Lateral Ventricles: Appears normal

Thalami/CSP: Appears normal

Posterior Fossa:  Not visualized

Nuchal Region: Not visualized

Upper Lip: Not visualized

Spine: Appears normal

4 Chamber Heart on Left: Appears normal

LVOT: Appears normal

RVOT: Appears normal

Stomach on Left: Appears normal

3 Vessel Cord: Appears normal

Cord Insertion site: Appears normal

Kidneys: Appears normal

Bladder: Appears normal

Extremities: Appears normal

Sex: Not visualized

Technically difficult due to: Advanced gestational age and fetal
position

Maternal Findings:

Cervix:  Not evaluated, >34 weeks
IMPRESSION: Assigned gestational age is currently 37 weeks 4 days. Appropriate
fetal growth, with EFW at 68th percentile.

Subjectively decreased amniotic fluid volume, with AFI of 12.2 cm.

Suboptimal evaluation of fetal anatomy, however no fetal anomalies
visualized.

## 2019-08-11 ENCOUNTER — Ambulatory Visit: Payer: Medicaid Other | Attending: Internal Medicine

## 2019-08-11 DIAGNOSIS — Z20822 Contact with and (suspected) exposure to covid-19: Secondary | ICD-10-CM | POA: Insufficient documentation

## 2019-08-12 LAB — NOVEL CORONAVIRUS, NAA: SARS-CoV-2, NAA: NOT DETECTED

## 2019-08-12 LAB — SARS-COV-2, NAA 2 DAY TAT

## 2020-02-11 ENCOUNTER — Ambulatory Visit (INDEPENDENT_AMBULATORY_CARE_PROVIDER_SITE_OTHER): Payer: Self-pay | Admitting: Physician Assistant

## 2020-02-11 ENCOUNTER — Encounter: Payer: Self-pay | Admitting: Physician Assistant

## 2020-02-11 ENCOUNTER — Other Ambulatory Visit: Payer: Self-pay

## 2020-02-11 ENCOUNTER — Other Ambulatory Visit (HOSPITAL_COMMUNITY)
Admission: RE | Admit: 2020-02-11 | Discharge: 2020-02-11 | Disposition: A | Discharge status: Home / Self Care | Payer: Self-pay | Source: Ambulatory Visit | Attending: Physician Assistant | Admitting: Physician Assistant

## 2020-02-11 VITALS — BP 107/70 | HR 97 | Temp 98.6°F | Resp 16 | Ht 65.0 in | Wt 107.8 lb

## 2020-02-11 DIAGNOSIS — N898 Other specified noninflammatory disorders of vagina: Secondary | ICD-10-CM

## 2020-02-11 DIAGNOSIS — A749 Chlamydial infection, unspecified: Secondary | ICD-10-CM

## 2020-02-11 DIAGNOSIS — K1379 Other lesions of oral mucosa: Secondary | ICD-10-CM

## 2020-02-11 DIAGNOSIS — N889 Noninflammatory disorder of cervix uteri, unspecified: Secondary | ICD-10-CM

## 2020-02-11 DIAGNOSIS — A599 Trichomoniasis, unspecified: Secondary | ICD-10-CM

## 2020-02-11 DIAGNOSIS — B379 Candidiasis, unspecified: Secondary | ICD-10-CM

## 2020-02-11 MED ORDER — FLUCONAZOLE 150 MG PO TABS: 150.0000 mg | ORAL_TABLET | Freq: Every day | ORAL | 0 refills | Status: DC

## 2020-02-11 MED ORDER — VALACYCLOVIR HCL 1 G PO TABS: 1000.0000 mg | ORAL_TABLET | Freq: Two times a day (BID) | ORAL | 0 refills | Status: DC

## 2020-02-11 NOTE — Progress Notes (Signed)
Established patient visit   Patient: Gloria Morris   DOB: September 29, 1990   29 y.o. Female  MRN: [id number] Visit Date: 02/11/2020  Today's healthcare provider: Margaretann Loveless, PA-C   Chief Complaint  Patient presents with  . Vaginal Discharge  . Mouth Lesions   Subjective    Vaginal Discharge The patient's primary symptoms include genital lesions, pelvic pain and vaginal discharge. This is a new problem. The current episode started 1 to 4 weeks ago. The problem occurs constantly. The problem has been gradually worsening. The pain is mild. The problem affects both sides. She is not pregnant. Associated symptoms include abdominal pain, back pain, dysuria, frequency and nausea. Pertinent negatives include no anorexia, chills, constipation, diarrhea, fever, flank pain, painful intercourse, sore throat, urgency or vomiting. Associated symptoms comments: Mouth sores. The vaginal discharge was malodorous, milky, thick, white and yellow. There has been no bleeding. The symptoms are aggravated by urinating. She has tried acetaminophen for the symptoms. The treatment provided no relief. She is not sexually active (reports has not had sexual intercourse in 2 years since her child was born). It is unknown whether or not her partner has an STD. She uses nothing for contraception. Her menstrual history has been regular. Her past medical history is significant for vaginosis.     Patient Active Problem List   Diagnosis Date Noted  . Rubella non-immune status, antepartum 05/28/2017  . Anemia 05/28/2017  . Ingrown right big toenail 12/24/2012  . Contraception management 12/24/2012  . Generalized anxiety disorder 11/12/2012  . OCD (obsessive compulsive disorder) 11/12/2012  . Asperger's disorder 11/12/2012   Social History   Tobacco Use  . Smoking status: Former Games developer  . Smokeless tobacco: Never Used  Vaping Use  . Vaping Use: Never used  Substance Use Topics  . Alcohol use: No  .  Drug use: No   No Known Allergies     Medications: Outpatient Medications Prior to Visit  Medication Sig  . [DISCONTINUED] cyclobenzaprine (FLEXERIL) 5 MG tablet Take 1 tablet (5 mg total) by mouth 3 (three) times daily as needed for muscle spasms.  . [DISCONTINUED] ferrous sulfate (FERROUSUL) 325 (65 FE) MG tablet Take 1 tablet (325 mg total) by mouth 2 (two) times daily.  . [DISCONTINUED] fluocinolone (VANOS) 0.01 % cream Apply topically 2 (two) times daily.  . [DISCONTINUED] Fluocinolone Acetonide Scalp 0.01 % OIL Use daily on scalp prn for psoriasis  . [DISCONTINUED] fluticasone (FLONASE) 50 MCG/ACT nasal spray Place 2 sprays into both nostrils daily.  . [DISCONTINUED] meloxicam (MOBIC) 7.5 MG tablet TAKE 1 TABLET BY MOUTH EVERY DAY  . [DISCONTINUED] sertraline (ZOLOFT) 50 MG tablet Take 1 tablet (50 mg total) by mouth daily. Start by taking 1/2 pill daily for one week   No facility-administered medications prior to visit.    Review of Systems  Constitutional: Negative for chills and fever.  HENT: Negative for sore throat.   Respiratory: Negative.   Cardiovascular: Negative.   Gastrointestinal: Positive for abdominal pain and nausea. Negative for anorexia, constipation, diarrhea and vomiting.  Genitourinary: Positive for dysuria, frequency, pelvic pain and vaginal discharge. Negative for flank pain and urgency.  Musculoskeletal: Positive for back pain.    Last CBC Lab Results  Component Value Date   WBC 9.6 08/16/2017   HGB 10.9 (L) 08/16/2017   HCT 31.7 (L) 08/16/2017   MCV 95.7 08/16/2017   MCH 33.0 08/16/2017   RDW 13.5 08/16/2017   PLT 170 08/16/2017   Last  metabolic panel No results found for: GLUCOSE, NA, K, CL, CO2, BUN, CREATININE, GFRNONAA, GFRAA, CALCIUM, PHOS, PROT, ALBUMIN, LABGLOB, AGRATIO, BILITOT, ALKPHOS, AST, ALT, ANIONGAP    Objective    BP 107/70 (BP Location: Left Arm, Patient Position: Sitting, Cuff Size: Normal)   Pulse 97   Temp 98.6 F (37  C) (Oral)   Resp 16   Ht 5\' 5"  (1.651 m)   Wt 107 lb 12.8 oz (48.9 kg)   LMP 02/06/2020 (Exact Date)   SpO2 97%   Breastfeeding No   BMI 17.94 kg/m  BP Readings from Last 3 Encounters:  02/13/20 118/70  02/11/20 107/70  02/13/18 104/69   Wt Readings from Last 3 Encounters:  02/13/20 107 lb 8 oz (48.8 kg)  02/11/20 107 lb 12.8 oz (48.9 kg)  02/13/18 101 lb 9.6 oz (46.1 kg)      Physical Exam Vitals reviewed.  Constitutional:      General: She is not in acute distress.    Appearance: Normal appearance. She is well-developed and normal weight. She is not ill-appearing or diaphoretic.  HENT:     Head: Normocephalic and atraumatic.     Mouth/Throat:     Mouth: Mucous membranes are moist. Oral lesions (under the upper lip there is erythema and oral lesions) present.  Cardiovascular:     Rate and Rhythm: Normal rate and regular rhythm.     Heart sounds: Normal heart sounds. No murmur heard.  No friction rub. No gallop.   Pulmonary:     Effort: Pulmonary effort is normal. No respiratory distress.     Breath sounds: Normal breath sounds. No wheezing or rales.  Abdominal:     General: Abdomen is flat. Bowel sounds are normal. There is no distension.     Palpations: Abdomen is soft.     Tenderness: There is abdominal tenderness. There is no right CVA tenderness or left CVA tenderness.  Genitourinary:    Pubic Area: No rash.      Labia:        Right: Tenderness present. No rash, lesion or injury.        Left: Rash, tenderness and lesion present.      Vagina: Vaginal discharge, erythema, tenderness and lesions present.     Cervix: Discharge, friability, lesion and erythema present.     Uterus: Normal.      Adnexa: Right adnexa normal and left adnexa normal.       Right: No mass or tenderness.         Left: No mass or tenderness.      Musculoskeletal:     Cervical back: Normal range of motion and neck supple.  Lymphadenopathy:     Lower Body: No right inguinal adenopathy.  No left inguinal adenopathy.  Neurological:     Mental Status: She is alert.       Results for orders placed or performed in visit on 02/11/20  Herpes simplex virus culture   Specimen: Other   Other  Result Value Ref Range   HSV Culture/Type Comment (A)   Cervicovaginal ancillary only  Result Value Ref Range   Neisseria Gonorrhea Negative    Chlamydia Positive (A)    Trichomonas Positive (A)    Bacterial Vaginitis (gardnerella) Negative    Candida Vaginitis Negative    Candida Glabrata Negative    Comment Normal Reference Range Candida Species - Negative    Comment Normal Reference Range Candida Galbrata - Negative    Comment Normal Reference Range Trichomonas -  Negative    Comment Normal Reference Ranger Chlamydia - Negative    Comment      Normal Reference Range Neisseria Gonorrhea - Negative   Comment      Normal Reference Range Bacterial Vaginosis - Negative  Cytology - PAP  Result Value Ref Range   High risk HPV Negative    Adequacy      Satisfactory but limited for evaluation with partially obscuring   Adequacy      inflammation; transformation zone component present.   Diagnosis (A)     - Atypical squamous cells of undetermined significance (ASC-US)   Microorganisms Cellular changes associated with Herpes virus    Microorganisms Trichomonas vaginalis present    Comment Normal Reference Range HPV - Negative     Assessment & Plan     1. Yeast infection Gets yeast infections with antibiotics. Diflucan given as below.  - fluconazole (DIFLUCAN) 150 MG tablet; Take 1 tablet (150 mg total) by mouth daily.  Dispense: 3 tablet; Refill: 0  2. Vaginal discharge Swab collected. Awaiting results.  - Cervicovaginal ancillary only  3. Vaginal sore Suspect herpetic lesions. Valtrex prescribed as below. Culture obtained.  - Cervicovaginal ancillary only - valACYclovir (VALTREX) 1000 MG tablet; Take 1 tablet (1,000 mg total) by mouth 2 (two) times daily.  Dispense: 20  tablet; Refill: 0 - Herpes simplex virus culture  4. Mouth sore Possible herpetic source since lesions present vaginally. Will treat with valtrex as noted above.   5. Cervical lesion During exam cervix had lesions and discharge thus pap was completed. Awaiting results.  - Cytology - PAP  6. Chlamydia Swab was positive for chlamydia, trich and herpes. Azithromycin was given for chlamydia, valtrex already being used for herpes and Metronidazole will be sent for trichomoniasis.  - azithromycin (ZITHROMAX) 500 MG tablet; Take 2 tablets (1,000 mg total) by mouth once for 1 dose.  Dispense: 2 tablet; Refill: 0  7. Trichomoniasis See above medical treatment plan. - metroNIDAZOLE (FLAGYL) 500 MG tablet; Take 1 tablet (500 mg total) by mouth 2 (two) times daily.  Dispense: 14 tablet; Refill: 0   No follow-ups on file.      Delmer Islam, PA-C, have reviewed all documentation for this visit. The documentation on 02/19/20 for the exam, diagnosis, procedures, and orders are all accurate and complete.   Reine Just  [facility] [phone] (phone) [phone] (fax)  [facility]

## 2020-02-12 ENCOUNTER — Encounter: Payer: Self-pay | Admitting: Physician Assistant

## 2020-02-13 ENCOUNTER — Encounter: Payer: Self-pay | Admitting: Physician Assistant

## 2020-02-13 ENCOUNTER — Telehealth: Payer: Self-pay | Admitting: *Deleted

## 2020-02-13 ENCOUNTER — Ambulatory Visit (INDEPENDENT_AMBULATORY_CARE_PROVIDER_SITE_OTHER): Payer: Self-pay | Admitting: Physician Assistant

## 2020-02-13 ENCOUNTER — Other Ambulatory Visit: Payer: Self-pay

## 2020-02-13 VITALS — BP 118/70 | HR 85 | Temp 97.7°F | Resp 16 | Wt 107.5 lb

## 2020-02-13 DIAGNOSIS — R300 Dysuria: Secondary | ICD-10-CM

## 2020-02-13 DIAGNOSIS — B379 Candidiasis, unspecified: Secondary | ICD-10-CM

## 2020-02-13 LAB — POCT URINALYSIS DIPSTICK
Bilirubin, UA: NEGATIVE
Color, UA: NEGATIVE
Glucose, UA: NEGATIVE
Ketones, UA: NEGATIVE
Nitrite, UA: NEGATIVE
Protein, UA: POSITIVE — AB
Spec Grav, UA: 1.025 (ref 1.010–1.025)
Urobilinogen, UA: 0.2 E.U./dL
pH, UA: 6 (ref 5.0–8.0)

## 2020-02-13 MED ORDER — NYSTATIN-TRIAMCINOLONE 100000-0.1 UNIT/GM-% EX OINT: 1.0000 "application " | TOPICAL_OINTMENT | Freq: Two times a day (BID) | CUTANEOUS | 0 refills | Status: DC

## 2020-02-13 MED ORDER — CEPHALEXIN 500 MG PO CAPS: 500.0000 mg | ORAL_CAPSULE | Freq: Two times a day (BID) | ORAL | 0 refills | Status: DC

## 2020-02-13 NOTE — Progress Notes (Signed)
Established patient visit   Patient: Gloria Morris   DOB: 03-24-1991   29 y.o. Female  MRN: [id number] Visit Date: 02/13/2020  Today's healthcare provider: Margaretann Loveless, PA-C   No chief complaint on file.  Subjective    HPI  Patient with c/o burning when she urinates. Repots that is painful with walking, movement. Was seen on Wednesday and had pap, STD testing. Most likely what instigated urinary issue.  Patient Active Problem List   Diagnosis Date Noted  . Rubella non-immune status, antepartum 05/28/2017  . Anemia 05/28/2017  . Ingrown right big toenail 12/24/2012  . Contraception management 12/24/2012  . Generalized anxiety disorder 11/12/2012  . OCD (obsessive compulsive disorder) 11/12/2012  . Asperger's disorder 11/12/2012   Past Medical History:  Diagnosis Date  . Asperger syndrome 2009  . Asthma   . History of ADHD   . History of anxiety   . History of chicken pox    around age 63  . OCD (obsessive compulsive disorder)   . Psoriasis of scalp        Medications: Outpatient Medications Prior to Visit  Medication Sig  . fluconazole (DIFLUCAN) 150 MG tablet Take 1 tablet (150 mg total) by mouth daily.  . valACYclovir (VALTREX) 1000 MG tablet Take 1 tablet (1,000 mg total) by mouth 2 (two) times daily.   No facility-administered medications prior to visit.    Review of Systems  Constitutional: Negative.   Respiratory: Negative.   Cardiovascular: Negative.   Gastrointestinal: Positive for abdominal pain.  Genitourinary: Positive for dysuria, genital sores and pelvic pain.    Last CBC Lab Results  Component Value Date   WBC 9.6 08/16/2017   HGB 10.9 (L) 08/16/2017   HCT 31.7 (L) 08/16/2017   MCV 95.7 08/16/2017   MCH 33.0 08/16/2017   RDW 13.5 08/16/2017   PLT 170 08/16/2017   Last metabolic panel No results found for: GLUCOSE, NA, K, CL, CO2, BUN, CREATININE, GFRNONAA, GFRAA, CALCIUM, PHOS, PROT, ALBUMIN, LABGLOB, AGRATIO,  BILITOT, ALKPHOS, AST, ALT, ANIONGAP    Objective    BP 118/70 (BP Location: Left Arm, Patient Position: Sitting, Cuff Size: Normal)   Pulse 85   Temp 97.7 F (36.5 C) (Oral)   Resp 16   Wt 107 lb 8 oz (48.8 kg)   LMP 02/06/2020 (Exact Date)   BMI 17.89 kg/m  BP Readings from Last 3 Encounters:  02/13/20 118/70  02/11/20 107/70  02/13/18 104/69   Wt Readings from Last 3 Encounters:  02/13/20 107 lb 8 oz (48.8 kg)  02/11/20 107 lb 12.8 oz (48.9 kg)  02/13/18 101 lb 9.6 oz (46.1 kg)      Physical Exam Vitals reviewed.  Constitutional:      Appearance: Normal appearance. She is well-developed.  HENT:     Head: Normocephalic and atraumatic.  Pulmonary:     Effort: Pulmonary effort is normal. No respiratory distress.  Musculoskeletal:     Cervical back: Normal range of motion and neck supple.  Neurological:     Mental Status: She is alert.  Psychiatric:        Mood and Affect: Mood normal.        Behavior: Behavior normal.        Thought Content: Thought content normal.        Judgment: Judgment normal.       Results for orders placed or performed in visit on 02/13/20  POCT urinalysis dipstick  Result Value Ref Range  Color, UA Negative    Clarity, UA Clear    Glucose, UA Negative Negative   Bilirubin, UA Negative    Ketones, UA Negative    Spec Grav, UA 1.025 1.010 - 1.025   Blood, UA H. Moderate    pH, UA 6.0 5.0 - 8.0   Protein, UA Positive (A) Negative   Urobilinogen, UA 0.2 0.2 or 1.0 E.U./dL   Nitrite, UA Negative    Leukocytes, UA Moderate (2+) (A) Negative   Appearance     Odor      Assessment & Plan     1. Burning with urination Worsening symptoms. UA positive. Will treat empirically with Keflex. Continue to push fluids. Urine sent for culture. Will follow up pending C&S results. She is to call if symptoms do not improve or if they worsen. \ - cephALEXin (KEFLEX) 500 MG capsule; Take 1 capsule (500 mg total) by mouth 2 (two) times daily.   Dispense: 20 capsule; Refill: 0 - Urine Culture  2. Yeast infection Worsening due to external irritation and swelling. Will give mycolog as below. GoodRx card provided to assist with cost.  - nystatin-triamcinolone ointment (MYCOLOG); Apply 1 application topically 2 (two) times daily. External tissue only  Dispense: 30 g; Refill: 0   No follow-ups on file.      Delmer Islam, PA-C, have reviewed all documentation for this visit. The documentation on 02/13/20 for the exam, diagnosis, procedures, and orders are all accurate and complete.   Reine Just  [facility] [phone] (phone) [phone] (fax)  [facility]

## 2020-02-13 NOTE — Telephone Encounter (Signed)
Copied from CRM [id number]. Topic: General - Other >> Feb 13, 2020  8:28 AM Tamela Oddi wrote: Reason for CRM: Patient called to inform the doctor that she is still swollen and when urinating she is burning.  She would like an antibiotic.  Patient has also scheduled an appt. For this afternoon.  Will talk more about her symptoms at the appt.

## 2020-02-15 LAB — HERPES SIMPLEX VIRUS CULTURE

## 2020-02-16 ENCOUNTER — Encounter: Payer: Self-pay | Admitting: Physician Assistant

## 2020-02-16 LAB — CERVICOVAGINAL ANCILLARY ONLY
Bacterial Vaginitis (gardnerella): NEGATIVE
Candida Glabrata: NEGATIVE
Candida Vaginitis: NEGATIVE
Chlamydia: POSITIVE — AB
Comment: NEGATIVE
Comment: NEGATIVE
Comment: NEGATIVE
Comment: NEGATIVE
Comment: NEGATIVE
Comment: NORMAL
Neisseria Gonorrhea: NEGATIVE
Trichomonas: POSITIVE — AB

## 2020-02-16 LAB — URINE CULTURE

## 2020-02-16 MED ORDER — METRONIDAZOLE 500 MG PO TABS: 500.0000 mg | ORAL_TABLET | Freq: Two times a day (BID) | ORAL | 0 refills | Status: DC

## 2020-02-16 MED ORDER — AZITHROMYCIN 500 MG PO TABS: 1000.0000 mg | ORAL_TABLET | Freq: Once | ORAL | 0 refills | Status: DC

## 2020-02-17 LAB — CYTOLOGY - PAP
Comment: NEGATIVE
Diagnosis: UNDETERMINED — AB
High risk HPV: NEGATIVE

## 2020-02-18 LAB — URINE CULTURE

## 2020-02-19 ENCOUNTER — Encounter: Payer: Self-pay | Admitting: Physician Assistant

## 2020-02-19 ENCOUNTER — Telehealth: Payer: Self-pay

## 2020-02-19 ENCOUNTER — Telehealth: Payer: Self-pay | Admitting: *Deleted

## 2020-02-19 NOTE — Telephone Encounter (Signed)
-----   Message from Margaretann Loveless, [place] sent at 02/18/2020  2:02 PM EST ----- Pap was abnormal as herpes and trich were both present. It was HPV negative. Would recommend for [place] to repeat pap in one year.

## 2020-02-19 NOTE — Telephone Encounter (Signed)
Patient with questions/concerns with a recent diagnosis. Answered all questions at this time. Referred to OBGYN for additional information.

## 2020-02-19 NOTE — Progress Notes (Signed)
Message addressed via another message. 

## 2020-02-19 NOTE — Telephone Encounter (Signed)
Spoke to patient and she did review messages on MyChart but states she was waiting for another call back to schedule appointment. She scheduled appointment for 04/23/2020 for a pap only. Patient states that she is self pay and just want to have another pap sooner than a year. She reports going to pick up her medicine from the pharmacy already as well. Just a [place]

## 2020-02-23 ENCOUNTER — Telehealth: Payer: Self-pay | Admitting: *Deleted

## 2020-02-23 NOTE — Telephone Encounter (Signed)
Patient is calling with questions about her treatment and retesting for negative STD results. Patient is involved with new partner and wants to discuss all the precautions she should be doing at this point. Discussed protective IC and appointment scheduled to talk to PCP about suppressive therapy vs outbreak therapy.

## 2020-02-24 ENCOUNTER — Telehealth: Payer: Self-pay

## 2020-02-24 NOTE — Telephone Encounter (Signed)
Copied from CRM [id number]. Topic: General - Call Back - No Documentation >> Feb 23, 2020  9:47 AM Lyn Hollingshead D wrote: PT returning the call / please advise

## 2020-02-24 NOTE — Telephone Encounter (Signed)
It looks like this has been taking care of. (See previous phone message)  Pt has an appointment scheduled for 12/03

## 2020-03-01 ENCOUNTER — Other Ambulatory Visit (HOSPITAL_COMMUNITY)
Admission: RE | Admit: 2020-03-01 | Discharge: 2020-03-01 | Disposition: A | Discharge status: Home / Self Care | Payer: Self-pay | Source: Ambulatory Visit | Attending: Family Medicine | Admitting: Family Medicine

## 2020-03-01 ENCOUNTER — Ambulatory Visit: Payer: Self-pay

## 2020-03-01 ENCOUNTER — Ambulatory Visit (INDEPENDENT_AMBULATORY_CARE_PROVIDER_SITE_OTHER): Payer: Self-pay | Admitting: Family Medicine

## 2020-03-01 ENCOUNTER — Encounter: Payer: Self-pay | Admitting: Family Medicine

## 2020-03-01 ENCOUNTER — Other Ambulatory Visit: Payer: Self-pay

## 2020-03-01 VITALS — BP 100/65 | HR 80 | Temp 98.2°F | Resp 16 | Wt 107.0 lb

## 2020-03-01 DIAGNOSIS — N898 Other specified noninflammatory disorders of vagina: Secondary | ICD-10-CM

## 2020-03-01 DIAGNOSIS — R300 Dysuria: Secondary | ICD-10-CM | POA: Insufficient documentation

## 2020-03-01 DIAGNOSIS — N390 Urinary tract infection, site not specified: Secondary | ICD-10-CM

## 2020-03-01 LAB — POCT URINALYSIS DIPSTICK
Glucose, UA: NEGATIVE
Ketones, UA: NEGATIVE
Nitrite, UA: NEGATIVE
Protein, UA: POSITIVE — AB
Spec Grav, UA: >=1.03 — AB (ref 1.010–1.025)
Urobilinogen, UA: 0.2 E.U./dL
pH, UA: 6 (ref 5.0–8.0)

## 2020-03-01 MED ORDER — METRONIDAZOLE 500 MG PO TABS: 2000.0000 mg | ORAL_TABLET | Freq: Once | ORAL | 0 refills | Status: DC

## 2020-03-01 MED ORDER — METRONIDAZOLE 500 MG PO TABS: 2000.0000 mg | ORAL_TABLET | Freq: Once | ORAL | 2 refills | Status: AC

## 2020-03-01 MED ORDER — AZITHROMYCIN 500 MG PO TABS: 1000.0000 mg | ORAL_TABLET | Freq: Once | ORAL | 0 refills | Status: DC

## 2020-03-01 MED ORDER — AZITHROMYCIN 500 MG PO TABS
1000.0000 mg | ORAL_TABLET | Freq: Once | ORAL | 2 refills | Status: AC
Start: 2020-03-01 — End: 2020-03-01

## 2020-03-01 NOTE — Patient Instructions (Signed)
.   Please review the attached list of medications and notify my office if there are any errors.   . Please bring all of your medications to every appointment so we can make sure that our medication list is the same as yours.   

## 2020-03-01 NOTE — Telephone Encounter (Signed)
Patient called stating that she was in office for vaginal symptoms and burning with urination Nov 3 and 5th.  She was Dx with several STD's and placed on antibiotics. She states that she took them as prescribed but now has a vaginal discharge and burning when urinating.  She states that she has noticed some rectal bleeding with her BM's.  She states that she is not having any signs of herpes. She states she has no bumps.  She rates that pain /burning at 10.  She has no fever.Per protocol appointment scheduled today with Dr Virl Cagey by Maralyn Sago at office. Care advice was read to patient she verbalized understanding. Reason for Disposition . [1] Genital area looks infected (e.g., draining sore, spreading redness) AND [2] fever  Answer Assessment - Initial Assessment Questions 1. SYMPTOM: "What's the main symptom you're concerned about?" (e.g., pain, itching, dryness)     Burning while urinating and vaginal discharge 2. LOCATION: "Where is the  burning located?" (e.g., inside/outside, left/right)    Hard to tell 3. ONSET: "When did the burningstart?"   Nov 3 and 5 th 4. PAIN: "Is there any pain?" If Yes, ask: "How bad is it?" (Scale: 1-10; mild, moderate, severe)    10 5. ITCHING: "Is there any itching?" If Yes, ask: "How bad is it?" (Scale: 1-10; mild, moderate, severe)    yes 6. CAUSE: "What do you think is causing the discharge?" "Have you had the same problem before? What happened then?"     UTI vaginally not sure 7. OTHER SYMPTOMS: "Do you have any other symptoms?" (e.g., fever, itching, vaginal bleeding, pain with urination, injury to genital area, vaginal foreign body)    BM has bleeding 8. PREGNANCY: "Is there any chance you are pregnant?" "When was your last menstrual period?"    Birth control period Oct 29th  Protocols used: VAGINAL [facility]

## 2020-03-01 NOTE — Progress Notes (Signed)
Established patient visit   Patient: Gloria Morris   DOB: [birth year]   29 y.o. Female  MRN: [id number] Visit Date: 03/01/2020  Today's healthcare provider: Mila Merry, MD   Chief Complaint  Patient presents with  . Dysuria   Subjective    Dysuria  This is a recurrent problem. The problem has been waxing and waning. The quality of the pain is described as burning. Pertinent negatives include no chills, nausea or vomiting. recent STD infection    Patient was recently seen in the office on 02/11/2020 and 02/13/2020  by Joycelyn Man, PA-C for evaluation of symptoms. 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 Patient was  placed on valtrex for herpes. Chlamydia was treated with azithromycin 1g one dose. Trichomonas was treated with metronidazole 500mg  BID x 7 days. Urine culture was also obtained and was positive for infection. Patient was treated with Keflex. She was also prescribed nystatin-triamcinolone ointment (MYCOLOG) for treatment of yeast infection. She states that those sx all resolved while on antibiotic, but returned about 3 days ago. Most of sx are very similar to sx she was having at her 11-3 office visit. Today patient present stating that she is still having burning during urination. She reports having a possible cut or lesion on the labia. She states this is unlike her previous herpes outbreaks.   This morning she noticed some anal bleeding after having a bowel movement. She states the blood was a bright red color. She says the beginning of the bowel movement she had to strain to get the stool out. She states she has had similar episodes on rare occasions in the past always associated with constipation and straining bowel movement, but never when having soft stools.      Medications: Outpatient Medications Prior to Visit  Medication Sig  . norethindrone (MICRONOR) 0.35 MG tablet Take 1 tablet by mouth daily.  .  cephALEXin (KEFLEX) 500 MG capsule Take 1 capsule (500 mg total) by mouth 2 (two) times daily. (Patient not taking: Reported on 03/01/2020)  . fluconazole (DIFLUCAN) 150 MG tablet Take 1 tablet (150 mg total) by mouth daily. (Patient not taking: Reported on 03/01/2020)  . metroNIDAZOLE (FLAGYL) 500 MG tablet Take 1 tablet (500 mg total) by mouth 2 (two) times daily. (Patient not taking: Reported on 03/01/2020)  . [DISCONTINUED] nystatin-triamcinolone ointment (MYCOLOG) Apply 1 application topically 2 (two) times daily. External tissue only (Patient not taking: Reported on 03/01/2020)  . [DISCONTINUED] valACYclovir (VALTREX) 1000 MG tablet Take 1 tablet (1,000 mg total) by mouth 2 (two) times daily. (Patient not taking: Reported on 03/01/2020)   No facility-administered medications prior to visit.    Review of Systems  Constitutional: Negative for appetite change, chills, fatigue and fever.  Respiratory: Negative for chest tightness and shortness of breath.   Cardiovascular: Negative for chest pain and palpitations.  Gastrointestinal: Positive for abdominal pain (cramping) and anal bleeding. Negative for nausea and vomiting.  Genitourinary: Positive for dysuria and vaginal discharge (white-yellow color).  Neurological: Negative for dizziness and weakness.      Objective    BP 100/65 (BP Location: Left Arm, Patient Position: Sitting, Cuff Size: Normal)   Pulse 80   Temp 98.2 F (36.8 C) (Oral)   Resp 16   Wt 107 lb (48.5 kg)   LMP 02/06/2020 (Exact Date)   BMI 17.81 kg/m    Physical Exam  General appearance: Thin female, cooperative and in no acute distress  Results for orders placed or performed in visit on 03/01/20  POCT Urinalysis Dipstick  Result Value Ref Range   Color, UA amber    Clarity, UA cloudy    Glucose, UA Negative Negative   Bilirubin, UA small    Ketones, UA negative    Spec Grav, UA >=1.030 (A) 1.010 - 1.025   Blood, UA trace (non-hemolyzed)    pH, UA 6.0  5.0 - 8.0   Protein, UA Positive (A) Negative   Urobilinogen, UA 0.2 0.2 or 1.0 E.U./dL   Nitrite, UA negative    Leukocytes, UA Moderate (2+) (A) Negative   Appearance     Odor      Assessment & Plan     1. Vaginal discharge She states sx very similar to what she was experiencing at 11-3 visit when she tested positive for chlamydia and trichomonas, but dissimilar to prior  HSV outbreaks - metroNIDAZOLE (FLAGYL) 500 MG tablet; Take 4 tablets (2,000 mg total) by mouth once for 1 dose.  Dispense: 4 tablet; Refill: 2 - azithromycin (ZITHROMAX) 500 MG tablet; Take 2 tablets (1,000 mg total) by mouth once for 1 dose.  Dispense: 2 tablet; Refill: 2  2. Burning with urination  - Urine cytology ancillary only  3. Urinary tract infection without hematuria, site unspecified  - Urine Culture      The entirety of the information documented in the History of Present Illness, Review of Systems and Physical Exam were personally obtained by me. Portions of this information were initially documented by the CMA and reviewed by me for thoroughness and accuracy.      Mila Merry, MD  [facility] [phone] (phone) [phone] (fax)  [facility]

## 2020-03-03 LAB — URINE CYTOLOGY ANCILLARY ONLY
Bacterial Vaginitis-Urine: NEGATIVE
Candida Urine: NEGATIVE
Chlamydia: NEGATIVE
Comment: NEGATIVE
Comment: NEGATIVE
Comment: NORMAL
Neisseria Gonorrhea: NEGATIVE
Trichomonas: NEGATIVE

## 2020-03-04 LAB — URINE CULTURE

## 2020-03-05 ENCOUNTER — Other Ambulatory Visit: Payer: Self-pay | Admitting: Family Medicine

## 2020-03-05 DIAGNOSIS — N390 Urinary tract infection, site not specified: Secondary | ICD-10-CM

## 2020-03-05 MED ORDER — FLUCONAZOLE 150 MG PO TABS: 150.0000 mg | ORAL_TABLET | Freq: Once | ORAL | 0 refills | Status: AC

## 2020-03-05 MED ORDER — AMOXICILLIN 500 MG PO CAPS: 1000.0000 mg | ORAL_CAPSULE | Freq: Three times a day (TID) | ORAL | 0 refills | Status: AC

## 2020-03-12 ENCOUNTER — Ambulatory Visit: Payer: Self-pay | Admitting: Physician Assistant

## 2020-03-23 ENCOUNTER — Telehealth: Payer: Self-pay

## 2020-03-23 NOTE — Telephone Encounter (Signed)
Copied from CRM [id number]. Topic: General - Other >> Mar 23, 2020 10:17 AM Tamela Oddi wrote: Reason for CRM: Patient would like the nurse to call her regarding her pap test she had in November.  She is not sure if she should have a followup and would like to discuss at [phone]

## 2020-03-24 NOTE — Telephone Encounter (Signed)
Spoke with patient and advised her what the provider had recommended per patient if was to get sexually active again how would she know that she is negative.

## 2020-03-30 NOTE — Progress Notes (Deleted)
      Established patient visit   Patient: Gloria Morris   DOB: 24-Dec-1990   29 y.o. Female  MRN: [id number] Visit Date: 03/31/2020  Today's healthcare provider: Margaretann Loveless, PA-C   No chief complaint on file.  Subjective    Exposure to STD     ***  {Show patient history (optional):23778::" "}   Medications: Outpatient Medications Prior to Visit  Medication Sig  . norethindrone (MICRONOR) 0.35 MG tablet Take 1 tablet by mouth daily.   No facility-administered medications prior to visit.    Review of Systems  {Heme  Chem  Endocrine  Serology  Results Review (optional):23779::" "}  Objective    There were no vitals taken for this visit. {Show previous vital signs (optional):23777::" "}  Physical Exam  ***  No results found for any visits on 03/31/20.  Assessment & Plan     ***  No follow-ups on file.      {provider attestation***:1}   Reine Just  [facility] [phone] (phone) [phone] (fax)  [facility]

## 2020-03-31 ENCOUNTER — Ambulatory Visit: Payer: Self-pay | Admitting: Physician Assistant

## 2020-04-23 ENCOUNTER — Ambulatory Visit: Payer: Self-pay | Admitting: Physician Assistant

## 2020-04-27 ENCOUNTER — Telehealth: Payer: Self-pay

## 2020-04-27 NOTE — Telephone Encounter (Signed)
Patient reports she is late for her period with all symptoms, but negative pregnancy test. She would like to schedule apt for urine and blood pregnancy test. [phone]

## 2020-04-27 NOTE — Telephone Encounter (Signed)
LMVM TRC. Inquiring about when period was due and what symptoms she is having.

## 2020-04-28 ENCOUNTER — Other Ambulatory Visit: Payer: Self-pay

## 2020-04-28 ENCOUNTER — Telehealth: Payer: Self-pay | Admitting: Obstetrics & Gynecology

## 2020-04-28 ENCOUNTER — Other Ambulatory Visit: Payer: Self-pay | Admitting: Obstetrics & Gynecology

## 2020-04-28 DIAGNOSIS — N926 Irregular menstruation, unspecified: Secondary | ICD-10-CM

## 2020-04-28 NOTE — Telephone Encounter (Signed)
Patient is scheduled for 2:40 today

## 2020-04-28 NOTE — Telephone Encounter (Signed)
Can you add pt to lab schedule today.

## 2020-04-28 NOTE — Telephone Encounter (Signed)
Pt called stating missed period 5 weeks ago . How ever she is receiving negative pregnancy test. Pt is requesting to have labs done to check for pregnancy. Please advise.

## 2020-04-28 NOTE — Telephone Encounter (Signed)
Patient returning call.

## 2020-04-28 NOTE — Telephone Encounter (Signed)
Lab ordered for today, sch time to come by plz Also, update med list and see if still on birth control pill or not

## 2020-04-29 ENCOUNTER — Ambulatory Visit: Payer: Self-pay | Admitting: Adult Health

## 2020-04-29 LAB — BETA HCG QUANT (REF LAB): hCG Quant: <1 m[IU]/mL

## 2020-04-29 NOTE — Telephone Encounter (Signed)
Patient had lab drawn 04/28/20

## 2020-04-29 NOTE — Telephone Encounter (Signed)
LMVM TRC. Advised to speak to front desk and ask for [name]. Also advised I will try to reach her thru my chart.

## 2020-05-14 ENCOUNTER — Ambulatory Visit: Payer: Self-pay | Admitting: Obstetrics & Gynecology

## 2020-06-11 ENCOUNTER — Ambulatory Visit: Payer: Self-pay | Admitting: Obstetrics & Gynecology

## 2021-01-13 ENCOUNTER — Telehealth: Payer: Self-pay

## 2021-01-13 NOTE — Telephone Encounter (Signed)
Pt calling; has missed period; neg home preg test; can she have a Beta blood test?  [phone] Pt states with her first preg she didn't have a positive test until she was 20wks preg; LMP 9/5-10/22.  Adv to wait another week and repeat preg test; to call if still negative.  Pt asked if there was a certain preg test she should get; adv we had no preference.

## 2021-01-18 ENCOUNTER — Telehealth: Payer: Self-pay

## 2021-01-18 DIAGNOSIS — N926 Irregular menstruation, unspecified: Secondary | ICD-10-CM

## 2021-01-18 NOTE — Telephone Encounter (Signed)
Can you call and add to lab schedule

## 2021-01-18 NOTE — Telephone Encounter (Signed)
Pt calling reporting pregnancy symptoms. Nausea and food tasting weird.. Had 2 positive test then had 2  negative pregnancy test, would like a beta. Sep 10 th was last period.

## 2021-01-19 NOTE — Telephone Encounter (Signed)
Called and left voicemail for patient to call back to be scheduled. 

## 2021-01-20 NOTE — Telephone Encounter (Signed)
Called and left voicemail for patient to call back to be scheduled. 

## 2021-01-26 NOTE — Telephone Encounter (Signed)
Called and left voicemail for patient to call back to be scheduled. 

## 2021-02-15 ENCOUNTER — Other Ambulatory Visit: Payer: Self-pay | Admitting: Obstetrics & Gynecology

## 2021-02-15 DIAGNOSIS — N926 Irregular menstruation, unspecified: Secondary | ICD-10-CM

## 2021-02-21 ENCOUNTER — Encounter: Payer: Self-pay | Admitting: Obstetrics

## 2021-06-28 ENCOUNTER — Telehealth: Payer: Self-pay

## 2021-06-28 NOTE — Telephone Encounter (Signed)
Pt calling with questions about ovulation, conceiving, and her period.  [phone]  Questions answered. ?

## 2021-07-08 ENCOUNTER — Telehealth: Payer: Self-pay

## 2021-07-08 NOTE — Telephone Encounter (Signed)
Pt calling; has question about HCG blood work.  [phone]  Pt wanted to know how early the beta HCG blood test would be positive; adv less than five weeks; pt's LMP 06/12/21 so should she go ahead and have the blood work done or wait until mid April; adv waiting 'til mid April would be better; pt states she will call back. ?

## 2021-11-04 ENCOUNTER — Other Ambulatory Visit: Payer: Self-pay

## 2021-11-04 ENCOUNTER — Telehealth: Payer: Self-pay

## 2021-11-04 NOTE — Telephone Encounter (Signed)
Pt called about a missed period, 5 days late, had one spot of blood today when she went to pee. Has cramps but this is not a normal period. Pt advised to take a home pregnancy test and if negative then call us next week if still no period and we can order a blood test. Pt states she did have intercourse but it was outside of her ovulating window.
